# Patient Record
Sex: Male | Born: 1970 | Race: White | Hispanic: No | Marital: Married | Smoking: Never smoker
Health system: Southern US, Community
[De-identification: ages and names within clinical notes are randomized; demographics above are authoritative.]

## PROBLEM LIST (undated history)

## (undated) DIAGNOSIS — N200 Calculus of kidney: Secondary | ICD-10-CM

## (undated) DIAGNOSIS — E785 Hyperlipidemia, unspecified: Secondary | ICD-10-CM

## (undated) HISTORY — DX: Calculus of kidney: N20.0

## (undated) HISTORY — DX: Hyperlipidemia, unspecified: E78.5

---

## 2006-09-26 ENCOUNTER — Ambulatory Visit: Payer: Self-pay | Admitting: Internal Medicine

## 2006-09-26 LAB — CONVERTED CEMR LAB
ALT: 29 units/L (ref 0–40)
AST: 18 units/L (ref 0–37)
Basophils Relative: 2.3 % — ABNORMAL HIGH (ref 0.0–1.0)
Bilirubin Urine: NEGATIVE
Calcium: 9.4 mg/dL (ref 8.4–10.5)
Chloride: 105 meq/L (ref 96–112)
Cholesterol: 230 mg/dL (ref 0–200)
Eosinophil percent: 5.1 % — ABNORMAL HIGH (ref 0.0–5.0)
Glucose, Bld: 98 mg/dL (ref 70–99)
HCT: 49.8 % (ref 39.0–52.0)
HDL: 36.1 mg/dL — ABNORMAL LOW (ref >39.0)
Leukocytes, UA: NEGATIVE
Lymphocytes Relative: 27.8 % (ref 12.0–46.0)
MCV: 88.6 fL (ref 78.0–100.0)
Monocytes Absolute: 0.5 10*3/uL (ref 0.2–0.7)
Neutrophils Relative %: 56.8 % (ref 43.0–77.0)
TSH: 1.92 microintl units/mL (ref 0.35–5.50)
VLDL: 64 mg/dL — ABNORMAL HIGH (ref 0–40)
WBC: 6.5 10*3/uL (ref 4.5–10.5)
pH: 6.5 (ref 5.0–8.0)

## 2006-09-30 ENCOUNTER — Ambulatory Visit: Payer: Self-pay | Admitting: Internal Medicine

## 2007-05-06 DIAGNOSIS — E785 Hyperlipidemia, unspecified: Secondary | ICD-10-CM

## 2007-05-06 HISTORY — DX: Hyperlipidemia, unspecified: E78.5

## 2008-04-29 ENCOUNTER — Ambulatory Visit: Payer: Self-pay | Admitting: Internal Medicine

## 2008-04-29 DIAGNOSIS — F411 Generalized anxiety disorder: Secondary | ICD-10-CM | POA: Insufficient documentation

## 2008-04-29 DIAGNOSIS — R079 Chest pain, unspecified: Secondary | ICD-10-CM

## 2008-04-29 HISTORY — DX: Chest pain, unspecified: R07.9

## 2008-04-29 LAB — CONVERTED CEMR LAB: Vit D, 1,25-Dihydroxy: 26 — ABNORMAL LOW (ref 30–89)

## 2008-04-30 ENCOUNTER — Encounter: Payer: Self-pay | Admitting: Internal Medicine

## 2008-04-30 ENCOUNTER — Encounter (INDEPENDENT_AMBULATORY_CARE_PROVIDER_SITE_OTHER): Payer: Self-pay | Admitting: *Deleted

## 2008-05-14 ENCOUNTER — Ambulatory Visit: Payer: Self-pay

## 2008-05-30 ENCOUNTER — Ambulatory Visit: Payer: Self-pay | Admitting: Internal Medicine

## 2008-09-30 ENCOUNTER — Ambulatory Visit: Payer: Self-pay | Admitting: Internal Medicine

## 2008-10-02 LAB — CONVERTED CEMR LAB
Bilirubin, Direct: 0.1 mg/dL (ref 0.0–0.3)
Direct LDL: 111.7 mg/dL
HDL: 35.8 mg/dL — ABNORMAL LOW (ref >39.0)
Total Protein: 7.2 g/dL (ref 6.0–8.3)
Triglycerides: 203 mg/dL (ref 0–149)
VLDL: 41 mg/dL — ABNORMAL HIGH (ref 0–40)

## 2008-11-27 ENCOUNTER — Ambulatory Visit: Payer: Self-pay | Admitting: Internal Medicine

## 2008-11-27 DIAGNOSIS — M25559 Pain in unspecified hip: Secondary | ICD-10-CM | POA: Insufficient documentation

## 2009-05-30 ENCOUNTER — Ambulatory Visit: Payer: Self-pay | Admitting: Internal Medicine

## 2009-05-30 DIAGNOSIS — Z91018 Allergy to other foods: Secondary | ICD-10-CM | POA: Insufficient documentation

## 2009-12-16 ENCOUNTER — Ambulatory Visit: Payer: Self-pay | Admitting: Internal Medicine

## 2009-12-16 DIAGNOSIS — R209 Unspecified disturbances of skin sensation: Secondary | ICD-10-CM | POA: Insufficient documentation

## 2010-01-23 ENCOUNTER — Ambulatory Visit: Payer: Self-pay | Admitting: Internal Medicine

## 2010-01-23 DIAGNOSIS — J302 Other seasonal allergic rhinitis: Secondary | ICD-10-CM | POA: Insufficient documentation

## 2010-01-23 DIAGNOSIS — J019 Acute sinusitis, unspecified: Secondary | ICD-10-CM | POA: Insufficient documentation

## 2010-05-26 ENCOUNTER — Ambulatory Visit: Payer: Self-pay | Admitting: Internal Medicine

## 2010-05-27 LAB — CONVERTED CEMR LAB
ALT: 32 units/L (ref 0–53)
AST: 25 units/L (ref 0–37)
Alkaline Phosphatase: 57 units/L (ref 39–117)
BUN: 16 mg/dL (ref 6–23)
Basophils Relative: 0.4 % (ref 0.0–3.0)
Bilirubin, Direct: 0.1 mg/dL (ref 0.0–0.3)
Chloride: 102 meq/L (ref 96–112)
Creatinine, Ser: 1.2 mg/dL (ref 0.4–1.5)
Eosinophils Relative: 4 % (ref 0.0–5.0)
GFR calc non Af Amer: 70.08 mL/min (ref >60)
HCT: 48.5 % (ref 39.0–52.0)
Ketones, ur: NEGATIVE mg/dL
Leukocytes, UA: NEGATIVE
MCV: 91.1 fL (ref 78.0–100.0)
Monocytes Relative: 9.2 % (ref 3.0–12.0)
Neutrophils Relative %: 61.2 % (ref 43.0–77.0)
Nitrite: NEGATIVE
PSA: 0.48 ng/mL (ref 0.10–4.00)
Platelets: 228 10*3/uL (ref 150.0–400.0)
Potassium: 5.1 meq/L (ref 3.5–5.1)
RBC: 5.32 M/uL (ref 4.22–5.81)
Specific Gravity, Urine: 1.01 (ref 1.000–1.030)
Total Bilirubin: 0.8 mg/dL (ref 0.3–1.2)
Total CHOL/HDL Ratio: 6
Total Protein: 7.2 g/dL (ref 6.0–8.3)
Urobilinogen, UA: 0.2 (ref 0.0–1.0)
VLDL: 60.8 mg/dL — ABNORMAL HIGH (ref 0.0–40.0)
Vitamin B-12: 348 pg/mL (ref 211–911)
WBC: 7 10*3/uL (ref 4.5–10.5)
pH: 6.5 (ref 5.0–8.0)

## 2010-06-02 ENCOUNTER — Ambulatory Visit: Payer: Self-pay | Admitting: Internal Medicine

## 2010-10-25 LAB — CONVERTED CEMR LAB
ALT: 33 units/L (ref 0–53)
AST: 24 units/L (ref 0–37)
Albumin: 4.8 g/dL (ref 3.5–5.2)
Alkaline Phosphatase: 60 units/L (ref 39–117)
Basophils Relative: 0.7 % (ref 0.0–3.0)
Bilirubin Urine: NEGATIVE
CK-MB: 1.1 ng/mL (ref 0.3–4.0)
Calcium: 9.6 mg/dL (ref 8.4–10.5)
Cholesterol: 155 mg/dL (ref 0–200)
Direct LDL: 110.7 mg/dL
Eosinophils Relative: 1.4 % (ref 0.0–5.0)
GFR calc Af Amer: 87 mL/min
GFR calc non Af Amer: 78.61 mL/min (ref >60)
Glucose, Bld: 92 mg/dL (ref 70–99)
HCT: 46.4 % (ref 39.0–52.0)
HDL: 35.5 mg/dL — ABNORMAL LOW (ref >39.0)
Hemoglobin: 16.4 g/dL (ref 13.0–17.0)
IgE (Immunoglobulin E), Serum: 37.8 intl units/mL (ref 0.0–180.0)
Leukocytes, UA: NEGATIVE
Lymphocytes Relative: 21.9 % (ref 12.0–46.0)
Lymphs Abs: 1.4 10*3/uL (ref 0.7–4.0)
Monocytes Relative: 4.8 % (ref 3.0–12.0)
Neutro Abs: 4.4 10*3/uL (ref 1.4–7.7)
Nitrite: NEGATIVE
Potassium: 4 meq/L (ref 3.5–5.1)
RBC: 5.21 M/uL (ref 4.22–5.81)
RDW: 11.8 % (ref 11.5–14.6)
Sodium: 138 meq/L (ref 135–145)
Sodium: 141 meq/L (ref 135–145)
TSH: 1.77 microintl units/mL (ref 0.35–5.50)
Total CHOL/HDL Ratio: 6.3
Total Protein, Urine: NEGATIVE mg/dL
Total Protein: 7.7 g/dL (ref 6.0–8.3)
Triglycerides: 390 mg/dL (ref 0–149)
WBC: 6.2 10*3/uL (ref 4.5–10.5)
pH: 7.5 (ref 5.0–8.0)

## 2010-10-27 NOTE — Assessment & Plan Note (Signed)
Summary: CONGESTION/ AVP'S PT /NWS   Vital Signs:  Patient profile:   40 year old male Height:      71 inches Weight:      185 pounds BMI:     25.90 O2 Sat:      97 % on Room air Temp:     98.1 degrees F oral Pulse rate:   88 / minute Pulse rhythm:   regular Resp:     16 per minute BP sitting:   112 / 72  (left arm) Cuff size:   large  Vitals Entered By: Rock Nephew CMA (January 23, 2010 9:24 AM)  Nutrition Counseling: Patient's BMI is greater than 25 and therefore counseled on weight management options.  O2 Flow:  Room air CC: allergy congestion, green mucus x 4 days, URI symptoms Is Patient Diabetic? No Pain Assessment Patient in pain? no        Primary Care Provider:  Georgina Quint Plotnikov MD  CC:  allergy congestion, green mucus x 4 days, and URI symptoms.  History of Present Illness:  URI Symptoms      This is a 41 year old man who presents with URI symptoms.  The symptoms began 2 weeks ago.  The severity is described as moderate.  The patient reports nasal congestion, purulent nasal discharge, and sore throat, but denies dry cough, productive cough, earache, and sick contacts.  The patient denies fever, stiff neck, dyspnea, wheezing, rash, vomiting, diarrhea, use of an antipyretic, and response to antipyretic.  The patient also reports itchy throat, sneezing, seasonal symptoms, and response to antihistamine.  The patient denies headache, muscle aches, and severe fatigue.  Risk factors for Strep sinusitis include unilateral facial pain, unilateral nasal discharge, poor response to decongestant, double sickening, and absence of cough.  The patient denies the following risk factors for Strep sinusitis: Strep exposure and tender adenopathy.    Preventive Screening-Counseling & Management  Caffeine-Diet-Exercise     Does Patient Exercise: yes      Drug Use:  no.    Current Medications (verified): 1)  Adult Aspirin Low Strength 81 Mg  Tbdp (Aspirin) .... Once Daily 2)   Vitamin D3 1000 Unit  Tabs (Cholecalciferol) .Marland Kitchen.. 1 By Mouth Daily 3)  Crestor 40 Mg Tabs (Rosuvastatin Calcium) .Marland Kitchen.. 1 By Mouth Qd  Allergies (verified): No Known Drug Allergies  Past History:  Past Medical History: Reviewed history from 05/06/2007 and no changes required. Hyperlipidemia  FAMILY H/O CAD  Past Surgical History: Reviewed history from 12/16/2009 and no changes required. Denies surgical history  Family History: Reviewed history from 04/29/2008 and no changes required. Family History Hypertension F CAD 44  Social History: Reviewed history from 12/16/2009 and no changes required. 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    Physical Exam  General:  alert, well-developed, well-nourished, well-hydrated, appropriate dress, normal appearance, healthy-appearing, cooperative to examination, and good hygiene.   Head:  normocephalic, atraumatic, no abnormalities observed, and no abnormalities palpated.   Eyes:  vision grossly intact, pupils equal, pupils round, and pupils reactive to light.   Ears:  R ear normal and L ear normal.   Nose:  no airflow obstruction, no intranasal foreign body, no nasal polyps, no nasal mucosal lesions, no mucosal friability,  no active bleeding or clots, no sinus percussion tenderness, no septum abnormalities, nasal dischargemucosal pallor, mucosal erythema, and mucosal edema.   Mouth:  good dentition, pharynx pink and moist, no erythema, no exudates, no posterior lymphoid hypertrophy, no postnasal drip, no pharyngeal crowing, no lesions, no aphthous ulcers, no erosions, no tongue abnormalities, no leukoplakia, and no petechiae.   Neck:  supple,  full ROM, no masses, no thyromegaly, no JVD, normal carotid upstroke, no carotid bruits, no cervical lymphadenopathy, and no neck tenderness.   Lungs:  normal respiratory effort, no intercostal retractions, no accessory muscle use, normal breath sounds, no dullness, no fremitus, no crackles, and no wheezes.   Heart:  normal rate, regular rhythm, no murmur, no gallop, no rub, and no JVD.   Abdomen:  soft, non-tender, normal bowel sounds, no distention, no masses, no guarding, no rigidity, no rebound tenderness, no hepatomegaly, and no splenomegaly.   Msk:  normal ROM, no joint tenderness, no joint swelling, no joint warmth, no redness over joints, no joint deformities, no joint instability, and no crepitation.   Pulses:  R and L carotid,radial,femoral,dorsalis pedis and posterior tibial pulses are full and equal bilaterally Extremities:  No clubbing, cyanosis, edema, or deformity noted with normal full range of motion of all joints.   Neurologic:  No cranial nerve deficits noted. Station and gait are normal. Plantar reflexes are down-going bilaterally. DTRs are symmetrical throughout. Sensory, motor and coordinative functions appear intact. Skin:  turgor normal, color normal, no rashes, no suspicious lesions, no ecchymoses, no petechiae, no purpura, no ulcerations, and no edema.   Cervical Nodes:  no anterior cervical adenopathy and no posterior cervical adenopathy.   Axillary Nodes:  no R axillary adenopathy and no L axillary adenopathy.   Inguinal Nodes:  no R inguinal adenopathy and no L inguinal adenopathy.   Psych:  Cognition and judgment appear intact. Alert and cooperative with normal attention span and concentration. No apparent delusions, illusions, hallucinations   Impression & Recommendations:  Problem # 1:  ALLERGIC RHINITIS DUE TO OTHER ALLERGEN (ICD-477.8) Assessment New continue taking claritin-d Orders: Admin of Therapeutic Inj  intramuscular or subcutaneous (96045) Depo- Medrol  40mg  (J1030) Depo- Medrol 80mg  (J1040)  Problem # 2:  SINUSITIS- ACUTE-NOS (ICD-461.9) Assessment: New  His updated medication list for this problem includes:    Cefuroxime Axetil 500 Mg Tabs (Cefuroxime axetil) ..... One by mouth two times a day  Complete Medication List: 1)  Adult Aspirin Low Strength 81 Mg Tbdp (Aspirin) .... Once daily 2)  Vitamin D3 1000 Unit Tabs (Cholecalciferol) .Marland Kitchen.. 1 by mouth daily 3)  Crestor 40 Mg Tabs (Rosuvastatin calcium) .Marland Kitchen.. 1 by mouth qd 4)  Cefuroxime Axetil 500 Mg Tabs (Cefuroxime axetil) .... One by mouth two times a day  Patient Instructions: 1)  Please schedule a follow-up appointment in 2 weeks. 2)  Take over the counter cough and cold medications only as directed on the package insert. DO NOT take more than recommended . 3)  Acute sinusitis symptoms for less than 10 days are not helped by antibiotics.Use warm moist compresses, and over the counter decongestants ( only as directed). Call if no improvement in 5-7 days, sooner if increasing pain, fever, or new symptoms. Prescriptions: CEFUROXIME AXETIL 500 MG TABS (CEFUROXIME AXETIL) One by mouth two times a day  #20 x 1   Entered and Authorized by:   Etta Grandchild MD   Signed by:   Etta Grandchild MD on 01/23/2010   Method used:  Electronically to        [facility]  [address] [phone]* (retail)       [address]       [place], [place]  [address]       Ph: [phone] or [phone]       Fax: [phone]   RxID:   [id number]    Medication Administration  Injection # 1:    Medication: Depo- Medrol 80mg     Diagnosis: ALLERGIC RHINITIS DUE TO OTHER ALLERGEN (ICD-477.8)    Route: IM    Site: R deltoid    Exp Date: 07/2012    Lot #: [id number]    Mfr: pfizer    Patient tolerated injection without complications    Given by: Rock Nephew CMA (January 23, 2010 9:42 AM)  Injection # 2:    Medication: Depo- Medrol 40mg     Diagnosis: ALLERGIC RHINITIS DUE TO OTHER ALLERGEN  (ICD-477.8)    Route: IM    Site: R deltoid    Exp Date: 07/2012    Lot #: [id number]    Mfr: pfizer    Patient tolerated injection without complications    Given by: Rock Nephew CMA (January 23, 2010 9:42 AM)  Orders Added: 1)  Admin of Therapeutic Inj  intramuscular or subcutaneous [96372] 2)  Depo- Medrol 40mg  [J1030] 3)  Depo- Medrol 80mg  [J1040] 4)  Est. Patient Level IV [37106]

## 2010-10-27 NOTE — Assessment & Plan Note (Signed)
Summary: PER PT 6 MTH FU--$50--STC  RS'D PER PT/NWS   Vital Signs:  Patient profile:   40 year old male Weight:      186 pounds Temp:     98.2 degrees F oral Pulse rate:   98 / minute BP sitting:   126 / 80  (left arm)  Vitals Entered By: Tora Perches (December 16, 2009 2:37 PM) CC: f/u Is Patient Diabetic? No   CC:  f/u.  History of Present Illness: F/u hyperlipidemia, stress, CP. C/o occasional L hand falling asleeep at night x 2-4 wks  Preventive Screening-Counseling & Management  Alcohol-Tobacco     Smoking Status: never  Current Medications (verified): 1)  Adult Aspirin Low Strength 81 Mg  Tbdp (Aspirin) .... Once Daily 2)  Vitamin D3 1000 Unit  Tabs (Cholecalciferol) .Marland Kitchen.. 1 By Mouth Daily 3)  Crestor 40 Mg Tabs (Rosuvastatin Calcium) .Marland Kitchen.. 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    Physical Exam  General:  Well-developed,well-nourished,in no acute distress; alert,appropriate and cooperative throughout examination Nose:  External nasal examination shows no deformity or inflammation. Nasal mucosa are pink and moist without lesions or exudates. Mouth:  Oral mucosa and oropharynx without lesions or exudates.  Teeth in good repair. Lungs:  Normal respiratory effort, chest expands symmetrically. Lungs are clear to auscultation, no crackles or wheezes. Heart:  Normal rate and regular rhythm. S1 and S2 normal without gallop, murmur, click, rub or other extra sounds. Abdomen:  Bowel sounds positive,abdomen soft and non-tender without masses, organomegaly or hernias noted. Msk:  No deformity or scoliosis noted of thoracic  or lumbar spine.   Neurologic:  No cranial nerve deficits noted. Station and gait are normal. Plantar reflexes are down-going bilaterally. DTRs are symmetrical throughout. Sensory, motor and coordinative functions appear intact. Skin:  Intact without suspicious lesions or rashes Psych:  Cognition and judgment appear intact. Alert and cooperative with normal attention span and concentration. No apparent delusions, illusions, hallucinations   Impression & Recommendations:  Problem # 1:  HYPERLIPIDEMIA (ICD-272.4) Assessment Improved  His updated medication list for this problem includes:    Crestor 40 Mg Tabs (Rosuvastatin calcium) .Marland Kitchen... 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 Once daily 2)  Vitamin D3 1000 Unit Tabs (Cholecalciferol) .Marland Kitchen.. 1 by mouth daily 3)  Crestor 40 Mg Tabs (Rosuvastatin calcium) .Marland Kitchen.. 1 by mouth qd  Patient Instructions: 1)  Please schedule a follow-up appointment in 6 months well w/labs and B12 v70.0 782.0. Prescriptions: CRESTOR 40 MG TABS (ROSUVASTATIN CALCIUM) 1 by mouth qd  #90 x 3   Entered and Authorized by:   Tresa Garter MD   Signed by:   Tresa Garter MD on 12/16/2009   Method used:   Print then Give to Patient   RxID:   [id number]

## 2011-02-12 NOTE — Assessment & Plan Note (Signed)
[facility]                           PRIMARY CARE OFFICE NOTE   Brandon Atkinson, Brandon Atkinson                  MRN:          [id number]  DATE:09/30/2006                            DOB:          September 23, 1971    The patient is a 40 year old male who presents for a wellness  examination.   Past medical history, family history and social history as per October 05, 2002. Planning to go to [place] to ski.   ALLERGIES:  None.   CURRENT MEDICATIONS:  Aspirin daily.   REVIEW OF SYSTEMS:  No chest pain or shortness of breath, very active  physically, no syncope. The rest is negative of the 18-point system  review is negative.   PHYSICAL EXAMINATION:  VITAL SIGNS:  Blood pressure 114/76, pulse 96,  temperature 98.2, weight 184 pounds (was 171).  GENERAL:  Looks well.  HEENT:  Moist mucosa.  NECK:  Supple, no thyromegaly or bruits.  LUNGS:  Clear, no wheezes or rales.  HEART:  S1, S2, no murmur, no gallop.  ABDOMEN:  Soft, nontender, no organomegaly or mass felt.  EXTREMITIES:  Lower extremity is without edema.  NEUROLOGIC:  He is alert, oriented and cooperative. He denies being  depressed.  SKIN:  Clear.   LABORATORY DATA:  In 2004, calcium scoring was negative. On October 27, 2005, CBC normal, CMET normal, cholesterol 238, triglycerides 320, TSH  normal, urinalysis normal.   ASSESSMENT/PLAN:  1. Normal wellness examination. Age/health-related issues discussed.      Healthy lifestyle discussed. We discussed his father's history of      early coronary disease. He will continue with aspirin. Will get      some kind of a stress test when he is about 40 years old. He will      let me know if there are any problems. He will continue to      exercise.  2. Dyslipidemia. Low fat diet, lose weight, continue to exercise. He      can use fish oil 1-2 daily. The option of taking a prescription      statin discussed again. He is not interested. Will recheck  labs in      6 months.  3. Travel. Ambien 10 mg 1/2-1 p.r.n.    Georgina Quint. Plotnikov, MD  Electronically Signed   AVP/MedQ  DD: 10/03/2006  DT: 10/03/2006  Job #: [id number]

## 2012-08-21 ENCOUNTER — Ambulatory Visit (INDEPENDENT_AMBULATORY_CARE_PROVIDER_SITE_OTHER): Payer: BC Managed Care – PPO | Admitting: Internal Medicine

## 2012-08-21 ENCOUNTER — Encounter: Payer: Self-pay | Admitting: Internal Medicine

## 2012-08-21 ENCOUNTER — Ambulatory Visit (INDEPENDENT_AMBULATORY_CARE_PROVIDER_SITE_OTHER)
Admission: RE | Admit: 2012-08-21 | Discharge: 2012-08-21 | Disposition: A | Discharge status: Home / Self Care | Payer: BC Managed Care – PPO | Source: Ambulatory Visit | Attending: Internal Medicine | Admitting: Internal Medicine

## 2012-08-21 VITALS — BP 138/80 | HR 102 | Temp 97.2°F | Ht 69.0 in | Wt 183.5 lb

## 2012-08-21 DIAGNOSIS — R079 Chest pain, unspecified: Secondary | ICD-10-CM

## 2012-08-21 DIAGNOSIS — R030 Elevated blood-pressure reading, without diagnosis of hypertension: Secondary | ICD-10-CM

## 2012-08-21 DIAGNOSIS — F411 Generalized anxiety disorder: Secondary | ICD-10-CM

## 2012-08-21 MED ORDER — OMEPRAZOLE 20 MG PO CPDR: 20.0000 mg | DELAYED_RELEASE_CAPSULE | Freq: Every day | ORAL | Status: DC

## 2012-08-21 NOTE — Assessment & Plan Note (Signed)
stable overall by hx and exam, most recent data reviewed with pt, and pt to continue medical treatment as before BP Readings from Last 3 Encounters:  08/21/12 138/80  01/23/10 112/72  12/16/09 126/80

## 2012-08-21 NOTE — Assessment & Plan Note (Addendum)
ECG reviewed as per emr, etiology unclear but fleeting, atypical, doubt CV related, for [facility] trial PPI, and  to f/u any worsening symptoms or concerns with PCP or sooner if needed

## 2012-08-21 NOTE — Progress Notes (Signed)
  Subjective:    Patient ID: Brandon Atkinson, male    DOB: 1971/03/24, 41 y.o.   MRN: [id number]  HPI  Here with c/o anterior CP, sharp, fleeting 1-2 seconds with tingling to right arm, without assoc symtpoms or n/v, diaphoresis, palps or syncope -  3 times per hr yesterday, none today, similar discomfort as before for several years but more freq yesterday, nonpleuritic, nonexertional , occurs at rest, thought to be ? Related to stress but less stress now that work has calmed down recently, though father died 2 yrs ago and has been thinking about that recently as well;  Ongoing Since his late 20's, last stress test 2009 neg for ischemia per pt.  Was prescirbed zantac 2009 but cannot recall if helped.  Denies worsening reflux, dysphagia, abd pain, n/v, bowel change or blood.   Pt denies fever, wt loss, night sweats, loss of appetite, or other constitutional symptoms  Worried mostly b/c of father hx of heart disease.  Did have recent elev BP as well but none recent. Denies worsening depressive symptoms, suicidal ideation, or panic Past Medical History  Diagnosis Date  . Hyperlipidemia    History reviewed. No pertinent past surgical history.  reports that he has never smoked. He has never used smokeless tobacco. He reports that he does not drink alcohol or use illicit drugs. family history includes Heart disease in his father and Hypertension in his father. No Known Allergies Current Outpatient Prescriptions on File Prior to Visit  Medication Sig Dispense Refill  . omeprazole (PRILOSEC) 20 MG capsule Take 1 capsule (20 mg total) by mouth daily.  90 capsule  3    aReview of Systems  Constitutional: Negative for diaphoresis and unexpected weight change.  HENT: Negative for tinnitus.   Eyes: Negative for photophobia and visual disturbance.  Respiratory: Negative for choking and stridor.   Gastrointestinal: Negative for vomiting and blood in stool.  Genitourinary: Negative for hematuria and  decreased urine volume.  Musculoskeletal: Negative for gait problem.  Skin: Negative for color change and wound.  Neurological: Negative for tremors and numbness.  Psychiatric/Behavioral: Negative for decreased concentration. The patient is not hyperactive.       Objective:   Physical Exam BP 138/80  Pulse 102  Temp 97.2 F (36.2 C) (Oral)  Ht 5\' 9"  (1.753 m)  Wt 183 lb 8 oz (83.235 kg)  BMI 27.10 kg/m2  SpO2 96% Physical Exam  VS noted, not ill appearing Constitutional: Pt appears well-developed and well-nourished.  HENT: Head: Normocephalic.  Right Ear: External ear normal.  Left Ear: External ear normal.  Eyes: Conjunctivae and EOM are normal. Pupils are equal, round, and reactive to light.  Neck: Normal range of motion. Neck supple.  Cardiovascular: Normal rate and regular rhythm.   Pulmonary/Chest: Effort normal and breath sounds normal.  Abd:  Soft, NT, non-distended, + BS Neurological: Pt is alert. Not confused  Skin: Skin is warm. No erythema.  Psychiatric: Pt behavior is normal. Thought content normal. 1+ normal    Assessment & Plan:

## 2012-08-21 NOTE — Assessment & Plan Note (Signed)
stable overall by hx and exam, most recent data reviewed with pt, and pt to continue medical treatment as before Lab Results  Component Value Date   LDLCALC 91 05/30/2009

## 2012-08-21 NOTE — Patient Instructions (Addendum)
Your EKG was ok today Take all new medications as prescribed - the prilosec 20 mg per day Please go to XRAY in the Basement for the x-ray test You will be contacted by phone if any changes need to be made immediately.  Otherwise, you will receive a letter about your results with an explanation, but please check with MyChart first. Thank you for enrolling in MyChart. Please follow the instructions below to securely access your online medical record. MyChart allows you to send messages to your doctor, view your test results, renew your prescriptions, schedule appointments, and more. To Log into MyChart, please go to [email or web address], and your Username is:  [email or web address]

## 2012-10-11 ENCOUNTER — Other Ambulatory Visit: Payer: Self-pay | Admitting: Internal Medicine

## 2012-10-24 ENCOUNTER — Other Ambulatory Visit (INDEPENDENT_AMBULATORY_CARE_PROVIDER_SITE_OTHER): Payer: BC Managed Care – PPO

## 2012-10-24 ENCOUNTER — Ambulatory Visit (INDEPENDENT_AMBULATORY_CARE_PROVIDER_SITE_OTHER): Payer: BC Managed Care – PPO | Admitting: Internal Medicine

## 2012-10-24 ENCOUNTER — Encounter: Payer: Self-pay | Admitting: Internal Medicine

## 2012-10-24 VITALS — BP 130/90 | HR 80 | Temp 97.0°F | Resp 16 | Wt 184.0 lb

## 2012-10-24 DIAGNOSIS — Z0000 Encounter for general adult medical examination without abnormal findings: Secondary | ICD-10-CM

## 2012-10-24 DIAGNOSIS — Z23 Encounter for immunization: Secondary | ICD-10-CM

## 2012-10-24 DIAGNOSIS — L219 Seborrheic dermatitis, unspecified: Secondary | ICD-10-CM | POA: Insufficient documentation

## 2012-10-24 DIAGNOSIS — E785 Hyperlipidemia, unspecified: Secondary | ICD-10-CM

## 2012-10-24 LAB — BASIC METABOLIC PANEL
BUN: 19 mg/dL (ref 6–23)
CO2: 29 mEq/L (ref 19–32)
Chloride: 102 mEq/L (ref 96–112)
Creatinine, Ser: 1.2 mg/dL (ref 0.4–1.5)

## 2012-10-24 LAB — CBC WITH DIFFERENTIAL/PLATELET
Basophils Relative: 0.5 % (ref 0.0–3.0)
Eosinophils Absolute: 0.2 10*3/uL (ref 0.0–0.7)
HCT: 49.2 % (ref 39.0–52.0)
Lymphs Abs: 1.6 10*3/uL (ref 0.7–4.0)
MCHC: 34 g/dL (ref 30.0–36.0)
MCV: 88.9 fl (ref 78.0–100.0)
Monocytes Absolute: 0.5 10*3/uL (ref 0.1–1.0)
Neutrophils Relative %: 64.5 % (ref 43.0–77.0)
RBC: 5.53 Mil/uL (ref 4.22–5.81)

## 2012-10-24 LAB — HEPATIC FUNCTION PANEL
ALT: 48 U/L (ref 0–53)
AST: 27 U/L (ref 0–37)
Alkaline Phosphatase: 54 U/L (ref 39–117)
Bilirubin, Direct: 0.1 mg/dL (ref 0.0–0.3)
Total Bilirubin: 0.7 mg/dL (ref 0.3–1.2)

## 2012-10-24 LAB — URINALYSIS
Ketones, ur: NEGATIVE
Specific Gravity, Urine: 1.015 (ref 1.000–1.030)
Total Protein, Urine: NEGATIVE
Urine Glucose: NEGATIVE
pH: 7 (ref 5.0–8.0)

## 2012-10-24 LAB — LIPID PANEL: Triglycerides: 213 mg/dL — ABNORMAL HIGH (ref 0.0–149.0)

## 2012-10-24 LAB — LDL CHOLESTEROL, DIRECT: Direct LDL: 105.5 mg/dL

## 2012-10-24 MED ORDER — CLOTRIMAZOLE-BETAMETHASONE 1-0.05 % EX CREA: TOPICAL_CREAM | Freq: Two times a day (BID) | CUTANEOUS | Status: DC

## 2012-10-24 MED ORDER — ROSUVASTATIN CALCIUM 40 MG PO TABS: 40.0000 mg | ORAL_TABLET | Freq: Every day | ORAL | Status: DC

## 2012-10-24 NOTE — Progress Notes (Signed)
  Subjective:     HPI  The patient is here for a wellness exam. The patient has been doing well overall without major physical or psychological issues going on lately. F/u dyslipidemia, chest pains, GERD  BP Readings from Last 3 Encounters:  10/24/12 130/90  08/21/12 138/80  01/23/10 112/72   Wt Readings from Last 3 Encounters:  10/24/12 184 lb (83.462 kg)  08/21/12 183 lb 8 oz (83.235 kg)  01/23/10 185 lb (83.915 kg)     Review of Systems  Constitutional: Negative for appetite change, fatigue and unexpected weight change.  HENT: Negative for hearing loss, nosebleeds, congestion, sore throat, sneezing, trouble swallowing and neck pain.   Eyes: Negative for itching and visual disturbance.  Respiratory: Negative for cough, chest tightness, wheezing and stridor.   Cardiovascular: Negative for chest pain, palpitations and leg swelling.  Gastrointestinal: Negative for nausea, vomiting, diarrhea, blood in stool and abdominal distention.  Genitourinary: Negative for frequency and hematuria.  Musculoskeletal: Negative for back pain, joint swelling and gait problem.  Skin: Negative for rash and wound.  Neurological: Negative for dizziness, tremors, speech difficulty and weakness.  Psychiatric/Behavioral: Negative for suicidal ideas, behavioral problems, confusion, sleep disturbance, dysphoric mood and agitation. The patient is not nervous/anxious.        Objective:   Physical Exam  Constitutional: He is oriented to person, place, and time. He appears well-developed.  HENT:  Mouth/Throat: Oropharynx is clear and moist.  Eyes: Conjunctivae normal are normal. Pupils are equal, round, and reactive to light.  Neck: Normal range of motion. No JVD present. No thyromegaly present.  Cardiovascular: Normal rate, regular rhythm, normal heart sounds and intact distal pulses.  Exam reveals no gallop and no friction rub.   No murmur heard. Pulmonary/Chest: Effort normal and breath sounds normal.  No respiratory distress. He has no wheezes. He has no rales. He exhibits no tenderness.  Abdominal: Soft. Bowel sounds are normal. He exhibits no distension and no mass. There is no tenderness. There is no rebound and no guarding.  Genitourinary:       Self exam nl  Musculoskeletal: Normal range of motion. He exhibits no edema and no tenderness.  Lymphadenopathy:    He has no cervical adenopathy.  Neurological: He is alert and oriented to person, place, and time. He has normal reflexes. No cranial nerve deficit. He exhibits normal muscle tone. Coordination normal.  Skin: Skin is warm and dry. No rash noted.  Psychiatric: He has a normal mood and affect. His behavior is normal. Judgment and thought content normal.   Lab Results  Component Value Date   WBC 7.0 05/26/2010   HGB 16.8 05/26/2010   HCT 48.5 05/26/2010   PLT 228.0 05/26/2010   GLUCOSE 83 05/26/2010   CHOL 214* 05/26/2010   TRIG 304.0* 05/26/2010   HDL 38.70* 05/26/2010   LDLDIRECT 126.2 05/26/2010   LDLCALC 91 05/30/2009   ALT 32 05/26/2010   AST 25 05/26/2010   NA 140 05/26/2010   K 5.1 05/26/2010   CL 102 05/26/2010   CREATININE 1.2 05/26/2010   BUN 16 05/26/2010   CO2 30 05/26/2010   TSH 1.23 05/26/2010   PSA 0.48 05/26/2010        Assessment & Plan:

## 2012-10-24 NOTE — Assessment & Plan Note (Signed)
Lotrisone prn 

## 2012-10-25 ENCOUNTER — Encounter: Payer: Self-pay | Admitting: Internal Medicine

## 2012-10-25 DIAGNOSIS — Z0000 Encounter for general adult medical examination without abnormal findings: Secondary | ICD-10-CM | POA: Insufficient documentation

## 2012-10-25 NOTE — Assessment & Plan Note (Signed)
We discussed age appropriate health related issues, including available/recomended screening tests and vaccinations. We discussed a need for adhering to healthy diet and exercise. Labs/EKG were reviewed/ordered. All questions were answered.   

## 2012-10-25 NOTE — Assessment & Plan Note (Signed)
Continue with current prescription therapy as reflected on the Med list.  

## 2012-11-11 ENCOUNTER — Other Ambulatory Visit: Payer: Self-pay

## 2013-08-02 ENCOUNTER — Other Ambulatory Visit: Payer: Self-pay

## 2013-12-10 ENCOUNTER — Ambulatory Visit (INDEPENDENT_AMBULATORY_CARE_PROVIDER_SITE_OTHER): Payer: BC Managed Care – PPO | Admitting: Internal Medicine

## 2013-12-10 ENCOUNTER — Encounter: Payer: Self-pay | Admitting: Internal Medicine

## 2013-12-10 ENCOUNTER — Other Ambulatory Visit (INDEPENDENT_AMBULATORY_CARE_PROVIDER_SITE_OTHER): Payer: BC Managed Care – PPO

## 2013-12-10 VITALS — BP 140/82 | HR 72 | Temp 97.9°F | Resp 16 | Ht 71.0 in | Wt 189.0 lb

## 2013-12-10 DIAGNOSIS — E785 Hyperlipidemia, unspecified: Secondary | ICD-10-CM

## 2013-12-10 DIAGNOSIS — Z0000 Encounter for general adult medical examination without abnormal findings: Secondary | ICD-10-CM

## 2013-12-10 DIAGNOSIS — R011 Cardiac murmur, unspecified: Secondary | ICD-10-CM

## 2013-12-10 DIAGNOSIS — R010 Benign and innocent cardiac murmurs: Secondary | ICD-10-CM | POA: Insufficient documentation

## 2013-12-10 LAB — CBC WITH DIFFERENTIAL/PLATELET
Basophils Absolute: 0 10*3/uL (ref 0.0–0.1)
Basophils Relative: 0.2 % (ref 0.0–3.0)
EOS PCT: 3.3 % (ref 0.0–5.0)
Eosinophils Absolute: 0.2 10*3/uL (ref 0.0–0.7)
HCT: 49.1 % (ref 39.0–52.0)
Hemoglobin: 16.9 g/dL (ref 13.0–17.0)
Lymphocytes Relative: 22.2 % (ref 12.0–46.0)
Lymphs Abs: 1.6 10*3/uL (ref 0.7–4.0)
MCHC: 34.5 g/dL (ref 30.0–36.0)
MCV: 89.1 fl (ref 78.0–100.0)
MONO ABS: 0.6 10*3/uL (ref 0.1–1.0)
Monocytes Relative: 9 % (ref 3.0–12.0)
Neutro Abs: 4.6 10*3/uL (ref 1.4–7.7)
Neutrophils Relative %: 65.3 % (ref 43.0–77.0)
PLATELETS: 243 10*3/uL (ref 150.0–400.0)
RBC: 5.51 Mil/uL (ref 4.22–5.81)
RDW: 12.4 % (ref 11.5–14.6)
WBC: 7 10*3/uL (ref 4.5–10.5)

## 2013-12-10 LAB — BASIC METABOLIC PANEL
BUN: 18 mg/dL (ref 6–23)
CALCIUM: 9.7 mg/dL (ref 8.4–10.5)
CO2: 32 mEq/L (ref 19–32)
Chloride: 101 mEq/L (ref 96–112)
Creatinine, Ser: 1.2 mg/dL (ref 0.4–1.5)
GFR: 70.95 mL/min (ref >60.00)
Glucose, Bld: 96 mg/dL (ref 70–99)
Potassium: 4.4 mEq/L (ref 3.5–5.1)
SODIUM: 139 meq/L (ref 135–145)

## 2013-12-10 LAB — URINALYSIS
Bilirubin Urine: NEGATIVE
HGB URINE DIPSTICK: NEGATIVE
KETONES UR: NEGATIVE
Leukocytes, UA: NEGATIVE
Nitrite: NEGATIVE
Specific Gravity, Urine: 1.02 (ref 1.000–1.030)
Total Protein, Urine: NEGATIVE
URINE GLUCOSE: NEGATIVE
UROBILINOGEN UA: 0.2 (ref 0.0–1.0)
pH: 6.5 (ref 5.0–8.0)

## 2013-12-10 LAB — LIPID PANEL
CHOL/HDL RATIO: 5
Cholesterol: 174 mg/dL (ref 0–200)
HDL: 38.4 mg/dL — AB (ref >39.00)
LDL Cholesterol: 94 mg/dL (ref 0–99)
Triglycerides: 209 mg/dL — ABNORMAL HIGH (ref 0.0–149.0)
VLDL: 41.8 mg/dL — ABNORMAL HIGH (ref 0.0–40.0)

## 2013-12-10 LAB — HEPATIC FUNCTION PANEL
ALK PHOS: 64 U/L (ref 39–117)
ALT: 33 U/L (ref 0–53)
AST: 24 U/L (ref 0–37)
Albumin: 5 g/dL (ref 3.5–5.2)
Bilirubin, Direct: 0.1 mg/dL (ref 0.0–0.3)
Total Bilirubin: 0.8 mg/dL (ref 0.3–1.2)
Total Protein: 7.9 g/dL (ref 6.0–8.3)

## 2013-12-10 LAB — TSH: TSH: 1.49 u[IU]/mL (ref 0.35–5.50)

## 2013-12-10 MED ORDER — ROSUVASTATIN CALCIUM 40 MG PO TABS: 40.0000 mg | ORAL_TABLET | Freq: Every day | ORAL | Status: DC

## 2013-12-10 NOTE — Progress Notes (Signed)
Pre visit review using our clinic review tool, if applicable. No additional management support is needed unless otherwise documented below in the visit note. 

## 2013-12-10 NOTE — Assessment & Plan Note (Signed)
We discussed age appropriate health related issues, including available/recomended screening tests and vaccinations. We discussed a need for adhering to healthy diet and exercise. Labs/EKG were reviewed/ordered. All questions were answered.   

## 2013-12-10 NOTE — Progress Notes (Signed)
   Subjective:     HPI  The patient is here for a wellness exam. The patient has been doing well overall without major physical or psychological issues going on lately.  F/u dyslipidemia, GERD  BP Readings from Last 3 Encounters:  12/10/13 140/82  10/24/12 130/90  08/21/12 138/80   Wt Readings from Last 3 Encounters:  12/10/13 189 lb (85.73 kg)  10/24/12 184 lb (83.462 kg)  08/21/12 183 lb 8 oz (83.235 kg)     Review of Systems  Constitutional: Negative for appetite change, fatigue and unexpected weight change.  HENT: Negative for congestion, hearing loss, nosebleeds, sneezing, sore throat and trouble swallowing.   Eyes: Negative for itching and visual disturbance.  Respiratory: Negative for cough, chest tightness, wheezing and stridor.   Cardiovascular: Negative for chest pain, palpitations and leg swelling.  Gastrointestinal: Negative for nausea, vomiting, diarrhea, blood in stool and abdominal distention.  Genitourinary: Negative for frequency and hematuria.  Musculoskeletal: Negative for back pain, gait problem, joint swelling and neck pain.  Skin: Negative for rash and wound.  Neurological: Negative for dizziness, tremors, speech difficulty and weakness.  Psychiatric/Behavioral: Negative for suicidal ideas, behavioral problems, confusion, sleep disturbance, dysphoric mood and agitation. The patient is not nervous/anxious.        Objective:   Physical Exam  Constitutional: He is oriented to person, place, and time. He appears well-developed.  HENT:  Mouth/Throat: Oropharynx is clear and moist.  Eyes: Conjunctivae are normal. Pupils are equal, round, and reactive to light.  Neck: Normal range of motion. No JVD present. No thyromegaly present.  Cardiovascular: Normal rate, regular rhythm, normal heart sounds and intact distal pulses.  Exam reveals no gallop and no friction rub.   No murmur heard. Pulmonary/Chest: Effort normal and breath sounds normal. No respiratory  distress. He has no wheezes. He has no rales. He exhibits no tenderness.  Abdominal: Soft. Bowel sounds are normal. He exhibits no distension and no mass. There is no tenderness. There is no rebound and no guarding.  Genitourinary:  Self exam nl  Musculoskeletal: Normal range of motion. He exhibits no edema and no tenderness.  Lymphadenopathy:    He has no cervical adenopathy.  Neurological: He is alert and oriented to person, place, and time. He has normal reflexes. No cranial nerve deficit. He exhibits normal muscle tone. Coordination normal.  Skin: Skin is warm and dry. No rash noted.  Psychiatric: He has a normal mood and affect. His behavior is normal. Judgment and thought content normal.  1-2 heart murmur Lab Results  Component Value Date   WBC 6.7 10/24/2012   HGB 16.7 10/24/2012   HCT 49.2 10/24/2012   PLT 234.0 10/24/2012   GLUCOSE 97 10/24/2012   CHOL 184 10/24/2012   TRIG 213.0* 10/24/2012   HDL 36.90* 10/24/2012   LDLDIRECT 105.5 10/24/2012   LDLCALC 91 05/30/2009   ALT 48 10/24/2012   AST 27 10/24/2012   NA 137 10/24/2012   K 4.2 10/24/2012   CL 102 10/24/2012   CREATININE 1.2 10/24/2012   BUN 19 10/24/2012   CO2 29 10/24/2012   TSH 1.64 10/24/2012   PSA 0.48 05/26/2010   EKG     Assessment & Plan:

## 2013-12-10 NOTE — Assessment & Plan Note (Signed)
2D ECHO 

## 2013-12-10 NOTE — Assessment & Plan Note (Signed)
Continue with current prescription therapy as reflected on the Med list.  

## 2015-12-22 ENCOUNTER — Encounter: Payer: Self-pay | Admitting: Internal Medicine

## 2015-12-22 ENCOUNTER — Ambulatory Visit (INDEPENDENT_AMBULATORY_CARE_PROVIDER_SITE_OTHER): Payer: BLUE CROSS/BLUE SHIELD | Admitting: Internal Medicine

## 2015-12-22 VITALS — BP 124/80 | HR 88 | Ht 71.0 in | Wt 189.0 lb

## 2015-12-22 DIAGNOSIS — Z0000 Encounter for general adult medical examination without abnormal findings: Secondary | ICD-10-CM

## 2015-12-22 NOTE — Assessment & Plan Note (Signed)
We discussed age appropriate health related issues, including available/recomended screening tests and vaccinations. We discussed a need for adhering to healthy diet and exercise. Labs/EKG were reviewed/ordered. All questions were answered.   

## 2015-12-22 NOTE — Assessment & Plan Note (Signed)
-   Crestor 

## 2015-12-22 NOTE — Progress Notes (Signed)
Pre visit review using our clinic review tool, if applicable. No additional management support is needed unless otherwise documented below in the visit note. 

## 2015-12-22 NOTE — Progress Notes (Signed)
Subjective:  Patient ID: Brandon Atkinson, male    DOB: Oct 18, 1970  Age: 45 y.o. MRN: [id number]  CC: Annual Exam   HPI Brandon NeedsRobert A Borenstein presents for a well exam  Outpatient Prescriptions Prior to Visit  Medication Sig Dispense Refill  . aspirin 81 MG tablet Take 81 mg by mouth daily.    . rosuvastatin (CRESTOR) 40 MG tablet Take 1 tablet (40 mg total) by mouth daily. 90 tablet 3  . VITAMIN D, CHOLECALCIFEROL, PO Take by mouth daily.    Marland Kitchen. omeprazole (PRILOSEC) 20 MG capsule Take 1 capsule (20 mg total) by mouth daily. (Patient not taking: Reported on 12/22/2015) 90 capsule 3   No facility-administered medications prior to visit.    ROS Review of Systems  Constitutional: Negative for appetite change, fatigue and unexpected weight change.  HENT: Negative for congestion, nosebleeds, sneezing, sore throat and trouble swallowing.   Eyes: Negative for itching and visual disturbance.  Respiratory: Negative for cough.   Cardiovascular: Negative for chest pain, palpitations and leg swelling.  Gastrointestinal: Negative for nausea, diarrhea, blood in stool and abdominal distention.  Genitourinary: Negative for frequency and hematuria.  Musculoskeletal: Negative for back pain, joint swelling, gait problem and neck pain.  Skin: Negative for rash.  Neurological: Negative for dizziness, tremors, speech difficulty and weakness.  Psychiatric/Behavioral: Negative for suicidal ideas, sleep disturbance, dysphoric mood and agitation. The patient is not nervous/anxious.     Objective:  BP 124/80 mmHg  Pulse 88  Ht 5\' 11"  (1.803 m)  Wt 189 lb (85.73 kg)  BMI 26.37 kg/m2  SpO2 98%  BP Readings from Last 3 Encounters:  12/22/15 124/80  12/10/13 140/82  10/24/12 130/90    Wt Readings from Last 3 Encounters:  12/22/15 189 lb (85.73 kg)  12/10/13 189 lb (85.73 kg)  10/24/12 184 lb (83.462 kg)    Physical Exam  Constitutional: He is oriented to person, place, and time. He appears  well-developed. No distress.  NAD  HENT:  Mouth/Throat: Oropharynx is clear and moist.  Eyes: Conjunctivae are normal. Pupils are equal, round, and reactive to light.  Neck: Normal range of motion. No JVD present. No thyromegaly present.  Cardiovascular: Normal rate, regular rhythm, normal heart sounds and intact distal pulses.  Exam reveals no gallop and no friction rub.   No murmur heard. Pulmonary/Chest: Effort normal and breath sounds normal. No respiratory distress. He has no wheezes. He has no rales. He exhibits no tenderness.  Abdominal: Soft. Bowel sounds are normal. He exhibits no distension and no mass. There is no tenderness. There is no rebound and no guarding.  Genitourinary: Penis normal. No penile tenderness.  Musculoskeletal: Normal range of motion. He exhibits no edema or tenderness.  Lymphadenopathy:    He has no cervical adenopathy.  Neurological: He is alert and oriented to person, place, and time. He has normal reflexes. No cranial nerve deficit. He exhibits normal muscle tone. He displays a negative Romberg sign. Coordination and gait normal.  Skin: Skin is warm and dry. No rash noted.  Psychiatric: He has a normal mood and affect. His behavior is normal. Judgment and thought content normal.  testes nl B  Lab Results  Component Value Date   WBC 7.0 12/10/2013   HGB 16.9 12/10/2013   HCT 49.1 12/10/2013   PLT 243.0 12/10/2013   GLUCOSE 96 12/10/2013   CHOL 174 12/10/2013   TRIG 209.0* 12/10/2013   HDL 38.40* 12/10/2013   LDLDIRECT 105.5 10/24/2012   LDLCALC 94  12/10/2013   ALT 33 12/10/2013   AST 24 12/10/2013   NA 139 12/10/2013   K 4.4 12/10/2013   CL 101 12/10/2013   CREATININE 1.2 12/10/2013   BUN 18 12/10/2013   CO2 32 12/10/2013   TSH 1.49 12/10/2013   PSA 0.48 05/26/2010    Dg Chest 2 View  08/21/2012  *RADIOLOGY REPORT* Clinical Data: Recurrent anterior chest pain. CHEST - 2 VIEW Comparison: 04/29/2008 Findings: Lungs are clear.   Cardiomediastinal silhouette and remainder of the exam is unchanged. IMPRESSION: No acute cardiopulmonary disease. Original Report Authenticated By: Elberta Fortis, M.D.    Assessment & Plan:   Renard was seen today for annual exam.  Diagnoses and all orders for this visit:  Well adult exam  I am having Mr. Sauls maintain his aspirin, (VITAMIN D, CHOLECALCIFEROL, PO), omeprazole, and rosuvastatin.  No orders of the defined types were placed in this encounter.     Follow-up: Return in about 1 year (around 12/21/2016) for Wellness Exam.  Sonda Primes, MD

## 2015-12-23 ENCOUNTER — Other Ambulatory Visit (INDEPENDENT_AMBULATORY_CARE_PROVIDER_SITE_OTHER): Payer: BLUE CROSS/BLUE SHIELD

## 2015-12-23 DIAGNOSIS — Z0000 Encounter for general adult medical examination without abnormal findings: Secondary | ICD-10-CM | POA: Diagnosis not present

## 2015-12-23 DIAGNOSIS — R7989 Other specified abnormal findings of blood chemistry: Secondary | ICD-10-CM

## 2015-12-23 LAB — PSA: PSA: 0.48 ng/mL (ref 0.10–4.00)

## 2015-12-23 LAB — CBC WITH DIFFERENTIAL/PLATELET
BASOS PCT: 0.5 % (ref 0.0–3.0)
Basophils Absolute: 0 10*3/uL (ref 0.0–0.1)
EOS PCT: 3.6 % (ref 0.0–5.0)
Eosinophils Absolute: 0.3 10*3/uL (ref 0.0–0.7)
HCT: 48.5 % (ref 39.0–52.0)
Hemoglobin: 16.6 g/dL (ref 13.0–17.0)
LYMPHS ABS: 1.8 10*3/uL (ref 0.7–4.0)
Lymphocytes Relative: 24.9 % (ref 12.0–46.0)
MCHC: 34.4 g/dL (ref 30.0–36.0)
MCV: 88.1 fl (ref 78.0–100.0)
MONO ABS: 0.5 10*3/uL (ref 0.1–1.0)
MONOS PCT: 7.3 % (ref 3.0–12.0)
NEUTROS ABS: 4.6 10*3/uL (ref 1.4–7.7)
NEUTROS PCT: 63.7 % (ref 43.0–77.0)
Platelets: 244 10*3/uL (ref 150.0–400.0)
RBC: 5.5 Mil/uL (ref 4.22–5.81)
RDW: 12.8 % (ref 11.5–15.5)
WBC: 7.3 10*3/uL (ref 4.0–10.5)

## 2015-12-23 LAB — URINALYSIS
BILIRUBIN URINE: NEGATIVE
HGB URINE DIPSTICK: NEGATIVE
Ketones, ur: NEGATIVE
LEUKOCYTES UA: NEGATIVE
NITRITE: NEGATIVE
PH: 7 (ref 5.0–8.0)
Specific Gravity, Urine: 1.015 (ref 1.000–1.030)
Total Protein, Urine: NEGATIVE
Urine Glucose: NEGATIVE
Urobilinogen, UA: 0.2 (ref 0.0–1.0)

## 2015-12-23 LAB — LIPID PANEL
CHOLESTEROL: 192 mg/dL (ref 0–200)
HDL: 34.5 mg/dL — ABNORMAL LOW (ref >39.00)
NonHDL: 157.9
TRIGLYCERIDES: 258 mg/dL — AB (ref 0.0–149.0)
Total CHOL/HDL Ratio: 6
VLDL: 51.6 mg/dL — AB (ref 0.0–40.0)

## 2015-12-23 LAB — BASIC METABOLIC PANEL
BUN: 17 mg/dL (ref 6–23)
CHLORIDE: 101 meq/L (ref 96–112)
CO2: 30 meq/L (ref 19–32)
Calcium: 10 mg/dL (ref 8.4–10.5)
Creatinine, Ser: 1.17 mg/dL (ref 0.40–1.50)
GFR: 71.01 mL/min (ref >60.00)
GLUCOSE: 96 mg/dL (ref 70–99)
POTASSIUM: 4.6 meq/L (ref 3.5–5.1)
SODIUM: 138 meq/L (ref 135–145)

## 2015-12-23 LAB — HEPATIC FUNCTION PANEL
ALBUMIN: 4.7 g/dL (ref 3.5–5.2)
ALT: 34 U/L (ref 0–53)
AST: 21 U/L (ref 0–37)
Alkaline Phosphatase: 62 U/L (ref 39–117)
Bilirubin, Direct: 0.1 mg/dL (ref 0.0–0.3)
Total Bilirubin: 0.5 mg/dL (ref 0.2–1.2)
Total Protein: 7.3 g/dL (ref 6.0–8.3)

## 2015-12-23 LAB — LDL CHOLESTEROL, DIRECT: Direct LDL: 95 mg/dL

## 2015-12-23 LAB — TSH: TSH: 1.66 u[IU]/mL (ref 0.35–4.50)

## 2016-02-09 ENCOUNTER — Telehealth: Payer: Self-pay | Admitting: Internal Medicine

## 2016-02-09 ENCOUNTER — Ambulatory Visit (INDEPENDENT_AMBULATORY_CARE_PROVIDER_SITE_OTHER): Payer: BLUE CROSS/BLUE SHIELD | Admitting: Family Medicine

## 2016-02-09 ENCOUNTER — Encounter: Payer: Self-pay | Admitting: Family Medicine

## 2016-02-09 VITALS — BP 118/80 | HR 88 | Temp 97.8°F | Ht 71.0 in | Wt 186.0 lb

## 2016-02-09 DIAGNOSIS — H8110 Benign paroxysmal vertigo, unspecified ear: Secondary | ICD-10-CM | POA: Insufficient documentation

## 2016-02-09 DIAGNOSIS — J302 Other seasonal allergic rhinitis: Secondary | ICD-10-CM | POA: Diagnosis not present

## 2016-02-09 MED ORDER — FLUTICASONE PROPIONATE 50 MCG/ACT NA SUSP: 2.0000 | Freq: Every day | NASAL | Status: DC

## 2016-02-09 NOTE — Assessment & Plan Note (Signed)
?   Allergy etiology Zyrtec and flonase rto prn

## 2016-02-09 NOTE — Telephone Encounter (Signed)
[facility] - Client TELEPHONE ADVICE RECORD [facility]  Patient [facility]ROBERT [facility]  DOB: [birth year]    Initial Comment Caller states her husband has been dizzy. He's under a lot of stress.    Nurse Assessment  Nurse: Laural BenesJohnson, RN, Dondra SpryGail Date/Time Lamount Cohen(Eastern Time): 02/09/2016 9:35:50 AM  Confirm and document reason for call. If symptomatic, describe symptoms. You must click the next button to save text entered. ---Brandon MaduroRobert is dizzy and is under a lot of stress. onset two to three days  Has the patient traveled out of the country within the last 30 days? ---No  Does the patient have any new or worsening symptoms? ---Yes  Will a triage be completed? ---Yes  Related visit to physician within the last 2 weeks? ---No  Does the PT have any chronic conditions? (i.e. diabetes, asthma, etc.) ---No  Is this a behavioral health or substance abuse call? ---No     Guidelines    Guideline Title Affirmed Question Affirmed Notes  Dizziness - Vertigo [1] MODERATE dizziness (e.g., vertigo; feels very unsteady, interferes with normal activities) AND [2] has NOT been evaluated by physician for this    Final Disposition User   See Physician within 24 Hours [place], RN, Dondra SpryGail    Comments  NOTE [facility] needs to be seen today leaving in am for [place] appt [facility]. Loreen FreudYvonne Lowne, MD at 400 pm today for vertigo x 3 days under alot of stress   Referrals  REFERRED TO PCP OFFICE   Disagree/Comply: Comply

## 2016-02-09 NOTE — Assessment & Plan Note (Signed)
Take zyrtec otc daily flonase This may be etiology of vertigo

## 2016-02-09 NOTE — Progress Notes (Signed)
Pre visit review using our clinic review tool, if applicable. No additional management support is needed unless otherwise documented below in the visit note. 

## 2016-02-09 NOTE — Patient Instructions (Signed)
Epley Maneuver Self-Care  WHAT IS THE EPLEY MANEUVER?  The Epley maneuver is an exercise you can do to relieve symptoms of benign paroxysmal positional vertigo (BPPV). This condition is often just referred to as vertigo. BPPV is caused by the movement of tiny crystals (canaliths) inside your inner ear. The accumulation and movement of canaliths in your inner ear causes a sudden spinning sensation (vertigo) when you move your head to certain positions. Vertigo usually lasts about 30 seconds. BPPV usually occurs in just one ear. If you get vertigo when you lie on your left side, you probably have BPPV in your left ear. Your health care provider can tell you which ear is involved.   BPPV may be caused by a head injury. Many people older than 50 get BPPV for unknown reasons. If you have been diagnosed with BPPV, your health care provider may teach you how to do this maneuver. BPPV is not life threatening (benign) and usually goes away in time.   WHEN SHOULD I PERFORM THE EPLEY MANEUVER?  You can do this maneuver at home whenever you have symptoms of vertigo. You may do the Epley maneuver up to 3 times a day until your symptoms of vertigo go away.  HOW SHOULD I DO THE EPLEY MANEUVER?  1. Sit on the edge of a bed or table with your back straight. Your legs should be extended or hanging over the edge of the bed or table.    2. Turn your head halfway toward the affected ear.    3. Lie backward quickly with your head turned until you are lying flat on your back. You may want to position a pillow under your shoulders.    4. Hold this position for 30 seconds. You may experience an attack of vertigo. This is normal. Hold this position until the vertigo stops.  5. Then turn your head to the opposite direction until your unaffected ear is facing the floor.    6. Hold this position for 30 seconds. You may experience an attack of vertigo. This is normal. Hold this position until the vertigo stops.  7. Now turn your whole body to  the same side as your head. Hold for another 30 seconds.    8. You can then sit back up.  ARE THERE RISKS TO THIS MANEUVER?  In some cases, you may have other symptoms (such as changes in your vision, weakness, or numbness). If you have these symptoms, stop doing the maneuver and call your health care provider. Even if doing these maneuvers relieves your vertigo, you may still have dizziness. Dizziness is the sensation of light-headedness but without the sensation of movement. Even though the Epley maneuver may relieve your vertigo, it is possible that your symptoms will return within 5 years.  WHAT SHOULD I DO AFTER THIS MANEUVER?  After doing the Epley maneuver, you can return to your normal activities. Ask your doctor if there is anything you should do at home to prevent vertigo. This may include:  · Sleeping with two or more pillows to keep your head elevated.  · Not sleeping on the side of your affected ear.  · Getting up slowly from bed.  · Avoiding sudden movements during the day.  · Avoiding extreme head movement, like looking up or bending over.  · Wearing a cervical collar to prevent sudden head movements.  WHAT SHOULD I DO IF MY SYMPTOMS GET WORSE?  Call your health care provider if your vertigo gets worse. Call your provider right way if   you have other symptoms, including:   · Nausea.  · Vomiting.  · Headache.  · Weakness.  · Numbness.  · Vision changes.     This information is not intended to replace advice given to you by your health care provider. Make sure you discuss any questions you have with your health care provider.     Document Released: 09/18/2013 Document Reviewed: 09/18/2013  Elsevier Interactive Patient Education ©2016 [employer]

## 2016-02-09 NOTE — Progress Notes (Signed)
Patient ID: Brandon Atkinson, male    DOB: March 23, 1971  Age: 45 y.o. MRN: [id number]    Subjective:  Subjective HPI Brandon Atkinson presents for dizziness that last 2 mornings.  He sat up in bed and room started to spin.  It lasted 5 sec and only occurred last to am--  Not during day.  He felt a little "woozie" a couple of times today but after lunch he felt better.     Review of Systems  Constitutional: Negative for diaphoresis, appetite change, fatigue and unexpected weight change.  Eyes: Negative for pain, redness and visual disturbance.  Respiratory: Negative for cough, chest tightness, shortness of breath and wheezing.   Cardiovascular: Negative for chest pain, palpitations and leg swelling.  Endocrine: Negative for cold intolerance, heat intolerance, polydipsia, polyphagia and polyuria.  Genitourinary: Negative for dysuria, frequency and difficulty urinating.  Neurological: Negative for dizziness, light-headedness, numbness and headaches.    History Past Medical History  Diagnosis Date  . Hyperlipidemia     He has no past surgical history on file.   His family history includes Cancer (age of onset: 35) in his father; Heart disease in his father; Hypertension in his father.He reports that he has never smoked. He has never used smokeless tobacco. He reports that he does not drink alcohol or use illicit drugs.  Current Outpatient Prescriptions on File Prior to Visit  Medication Sig Dispense Refill  . aspirin 81 MG tablet Take 81 mg by mouth daily.    . rosuvastatin (CRESTOR) 40 MG tablet Take 1 tablet (40 mg total) by mouth daily. 90 tablet 3  . omeprazole (PRILOSEC) 20 MG capsule Take 1 capsule (20 mg total) by mouth daily. (Patient not taking: Reported on 12/22/2015) 90 capsule 3  . VITAMIN D, CHOLECALCIFEROL, PO Take by mouth daily. Reported on 02/09/2016     No current facility-administered medications on file prior to visit.     Objective:  Objective Physical Exam    Constitutional: He is oriented to person, place, and time. Vital signs are normal. He appears well-developed and well-nourished. He is sleeping.  HENT:  Head: Normocephalic and atraumatic.  Nose: Rhinorrhea present. No mucosal edema. Right sinus exhibits no maxillary sinus tenderness and no frontal sinus tenderness. Left sinus exhibits no maxillary sinus tenderness and no frontal sinus tenderness.  Mouth/Throat: Posterior oropharyngeal erythema present. No oropharyngeal exudate or posterior oropharyngeal edema.  Eyes: EOM are normal. Pupils are equal, round, and reactive to light.  Neck: Normal range of motion. Neck supple. No thyromegaly present.  Cardiovascular: Normal rate and regular rhythm.   No murmur heard. Pulmonary/Chest: Effort normal and breath sounds normal. No respiratory distress. He has no wheezes. He has no rales. He exhibits no tenderness.  Musculoskeletal: He exhibits no edema or tenderness.  Neurological: He is alert and oriented to person, place, and time.  Skin: Skin is warm and dry.  Psychiatric: He has a normal mood and affect. His behavior is normal. Judgment and thought content normal.  Nursing note and vitals reviewed.  BP 118/80 mmHg  Pulse 88  Temp(Src) 97.8 F (36.6 C) (Oral)  Ht  (1.803 m)  Wt 186 lb (84.369 kg)  BMI 25.95 kg/m2  SpO2 98% Wt Readings from Last 3 Encounters:  02/09/16 186 lb (84.369 kg)  12/22/15 189 lb (85.73 kg)  12/10/13 189 lb (85.73 kg)     Lab Results  Component Value Date   WBC 7.3 12/23/2015   HGB 16.6 12/23/2015  HCT 48.5 12/23/2015   PLT 244.0 12/23/2015   GLUCOSE 96 12/23/2015   CHOL 192 12/23/2015   TRIG 258.0* 12/23/2015   HDL 34.50* 12/23/2015   LDLDIRECT 95.0 12/23/2015   LDLCALC 94 12/10/2013   ALT 34 12/23/2015   AST 21 12/23/2015   NA 138 12/23/2015   K 4.6 12/23/2015   CL 101 12/23/2015   CREATININE 1.17 12/23/2015   BUN 17 12/23/2015   CO2 30 12/23/2015   TSH 1.66 12/23/2015   PSA 0.48  12/23/2015    Dg Chest 2 View  08/21/2012  *RADIOLOGY REPORT* Clinical Data: Recurrent anterior chest pain. CHEST - 2 VIEW Comparison: 04/29/2008 Findings: Lungs are clear.  Cardiomediastinal silhouette and remainder of the exam is unchanged. IMPRESSION: No acute cardiopulmonary disease. Original Report Authenticated By: Elberta Fortisaniel Boyle, M.D.     Assessment & Plan:  Plan I am having Mr. Joung start on fluticasone. I am also having him maintain his aspirin, (VITAMIN D, CHOLECALCIFEROL, PO), omeprazole, and rosuvastatin.  Meds ordered this encounter  Medications  . fluticasone (FLONASE) 50 MCG/ACT nasal spray    Sig: Place 2 sprays into both nostrils daily.    Dispense:  16 g    Refill:  6    Problem List Items Addressed This Visit    None    Visit Diagnoses    Seasonal allergies    -  Primary    Relevant Medications    fluticasone (FLONASE) 50 MCG/ACT nasal spray       Follow-up: Return if symptoms worsen or fail to improve.  Donato SchultzYvonne R Lowne Chase, DO

## 2016-12-24 ENCOUNTER — Other Ambulatory Visit: Payer: Self-pay | Admitting: Internal Medicine

## 2017-08-29 ENCOUNTER — Other Ambulatory Visit: Payer: Self-pay | Admitting: Internal Medicine

## 2017-08-29 MED ORDER — ROSUVASTATIN CALCIUM 40 MG PO TABS: 40.0000 mg | ORAL_TABLET | Freq: Every day | ORAL | 0 refills | Status: DC

## 2017-08-29 NOTE — Telephone Encounter (Signed)
Pt has made appt for 10/05/17. Per office policy sent 30 day to local pharmacy until appt...Brandon Atkinson/lmb

## 2017-10-09 ENCOUNTER — Encounter: Payer: Self-pay | Admitting: Internal Medicine

## 2017-10-11 ENCOUNTER — Other Ambulatory Visit: Payer: Self-pay | Admitting: Internal Medicine

## 2017-10-11 DIAGNOSIS — R010 Benign and innocent cardiac murmurs: Secondary | ICD-10-CM

## 2017-10-11 DIAGNOSIS — Z0000 Encounter for general adult medical examination without abnormal findings: Secondary | ICD-10-CM

## 2017-10-12 ENCOUNTER — Other Ambulatory Visit (INDEPENDENT_AMBULATORY_CARE_PROVIDER_SITE_OTHER): Payer: Self-pay

## 2017-10-12 DIAGNOSIS — R010 Benign and innocent cardiac murmurs: Secondary | ICD-10-CM

## 2017-10-12 DIAGNOSIS — Z0000 Encounter for general adult medical examination without abnormal findings: Secondary | ICD-10-CM

## 2017-10-12 LAB — CBC WITH DIFFERENTIAL/PLATELET
Basophils Absolute: 0 10*3/uL (ref 0.0–0.1)
Basophils Relative: 0.7 % (ref 0.0–3.0)
EOS PCT: 4.8 % (ref 0.0–5.0)
Eosinophils Absolute: 0.3 10*3/uL (ref 0.0–0.7)
HCT: 47.2 % (ref 39.0–52.0)
Hemoglobin: 15.8 g/dL (ref 13.0–17.0)
LYMPHS ABS: 2 10*3/uL (ref 0.7–4.0)
Lymphocytes Relative: 29.5 % (ref 12.0–46.0)
MCHC: 33.5 g/dL (ref 30.0–36.0)
MCV: 89.7 fl (ref 78.0–100.0)
MONOS PCT: 9.2 % (ref 3.0–12.0)
Monocytes Absolute: 0.6 10*3/uL (ref 0.1–1.0)
NEUTROS ABS: 3.7 10*3/uL (ref 1.4–7.7)
NEUTROS PCT: 55.8 % (ref 43.0–77.0)
PLATELETS: 227 10*3/uL (ref 150.0–400.0)
RBC: 5.26 Mil/uL (ref 4.22–5.81)
RDW: 12.9 % (ref 11.5–15.5)
WBC: 6.7 10*3/uL (ref 4.0–10.5)

## 2017-10-12 LAB — BASIC METABOLIC PANEL
BUN: 22 mg/dL (ref 6–23)
CALCIUM: 9.2 mg/dL (ref 8.4–10.5)
CO2: 29 meq/L (ref 19–32)
Chloride: 102 mEq/L (ref 96–112)
Creatinine, Ser: 1.18 mg/dL (ref 0.40–1.50)
GFR: 69.78 mL/min (ref >60.00)
Glucose, Bld: 95 mg/dL (ref 70–99)
Potassium: 4.1 mEq/L (ref 3.5–5.1)
SODIUM: 139 meq/L (ref 135–145)

## 2017-10-12 LAB — LIPID PANEL
Cholesterol: 144 mg/dL (ref 0–200)
HDL: 32.9 mg/dL — AB (ref >39.00)
LDL Cholesterol: 75 mg/dL (ref 0–99)
NonHDL: 111.49
TRIGLYCERIDES: 183 mg/dL — AB (ref 0.0–149.0)
Total CHOL/HDL Ratio: 4
VLDL: 36.6 mg/dL (ref 0.0–40.0)

## 2017-10-12 LAB — HEPATIC FUNCTION PANEL
ALT: 33 U/L (ref 0–53)
AST: 33 U/L (ref 0–37)
Albumin: 4.5 g/dL (ref 3.5–5.2)
Alkaline Phosphatase: 64 U/L (ref 39–117)
Bilirubin, Direct: 0.1 mg/dL (ref 0.0–0.3)
Total Bilirubin: 0.7 mg/dL (ref 0.2–1.2)
Total Protein: 7 g/dL (ref 6.0–8.3)

## 2017-10-12 LAB — URINALYSIS
BILIRUBIN URINE: NEGATIVE
KETONES UR: NEGATIVE
Leukocytes, UA: NEGATIVE
NITRITE: NEGATIVE
PH: 6 (ref 5.0–8.0)
SPECIFIC GRAVITY, URINE: 1.02 (ref 1.000–1.030)
TOTAL PROTEIN, URINE-UPE24: NEGATIVE
Urine Glucose: NEGATIVE
Urobilinogen, UA: 0.2 (ref 0.0–1.0)

## 2017-10-12 LAB — TSH: TSH: 1.88 u[IU]/mL (ref 0.35–4.50)

## 2017-10-13 ENCOUNTER — Telehealth: Payer: Self-pay | Admitting: Internal Medicine

## 2017-10-13 ENCOUNTER — Other Ambulatory Visit (INDEPENDENT_AMBULATORY_CARE_PROVIDER_SITE_OTHER): Payer: No Typology Code available for payment source

## 2017-10-13 ENCOUNTER — Encounter: Payer: Self-pay | Admitting: Internal Medicine

## 2017-10-13 ENCOUNTER — Ambulatory Visit: Payer: No Typology Code available for payment source | Admitting: Internal Medicine

## 2017-10-13 VITALS — BP 122/74 | HR 86 | Temp 98.3°F | Ht 71.0 in | Wt 203.0 lb

## 2017-10-13 DIAGNOSIS — R010 Benign and innocent cardiac murmurs: Secondary | ICD-10-CM

## 2017-10-13 DIAGNOSIS — E785 Hyperlipidemia, unspecified: Secondary | ICD-10-CM

## 2017-10-13 DIAGNOSIS — Z8249 Family history of ischemic heart disease and other diseases of the circulatory system: Secondary | ICD-10-CM

## 2017-10-13 DIAGNOSIS — Z0000 Encounter for general adult medical examination without abnormal findings: Secondary | ICD-10-CM | POA: Diagnosis not present

## 2017-10-13 LAB — PSA: PSA: 0.48 ng/mL (ref 0.10–4.00)

## 2017-10-13 MED ORDER — VITAMIN D3 50 MCG (2000 UT) PO CAPS: 2000.0000 [IU] | ORAL_CAPSULE | Freq: Every day | ORAL | 3 refills | Status: AC

## 2017-10-13 MED ORDER — B COMPLEX PO TABS: 1.0000 | ORAL_TABLET | Freq: Every day | ORAL | 3 refills | Status: AC

## 2017-10-13 MED ORDER — ROSUVASTATIN CALCIUM 40 MG PO TABS: 40.0000 mg | ORAL_TABLET | Freq: Every day | ORAL | 3 refills | Status: DC

## 2017-10-13 NOTE — Assessment & Plan Note (Addendum)
On Crestor Cardiol ref

## 2017-10-13 NOTE — Telephone Encounter (Signed)
Copied from CRM [phone]. Topic: Quick Communication - See Telephone Encounter >> Oct 13, 2017 10:53 AM Floria RavelingStovall, Shana A wrote: CRM for notification. See Telephone encounter for:  pt called in and said that the  rosuvastatin (CRESTOR) 40 MG tablet [[id number]][[id number]] was sent to [facility] and it is $140.00 there .  [facility] [place] [facility] [place]  [place]65.00 there   10/13/17.

## 2017-10-13 NOTE — Patient Instructions (Signed)
B complex

## 2017-10-13 NOTE — Assessment & Plan Note (Signed)
We discussed age appropriate health related issues, including available/recomended screening tests and vaccinations. We discussed a need for adhering to healthy diet and exercise. Labs were ordered to be later reviewed . All questions were answered.   

## 2017-10-13 NOTE — Progress Notes (Signed)
Subjective:  Patient ID: Brandon Atkinson, male    DOB: [birth year]  Age: 47 y.o. MRN: [id number]  CC: No chief complaint on file.   HPI VALERIA KRISKO presents for well exam   Outpatient Medications Prior to Visit  Medication Sig Dispense Refill  . aspirin 81 MG tablet Take 81 mg by mouth daily.    . rosuvastatin (CRESTOR) 40 MG tablet Take 1 tablet (40 mg total) by mouth daily. Must keep jan appt w/MD for future refills 30 tablet 0  . fluticasone (FLONASE) 50 MCG/ACT nasal spray Place 2 sprays into both nostrils daily. (Patient not taking: Reported on 10/13/2017) 16 g 6  . omeprazole (PRILOSEC) 20 MG capsule Take 1 capsule (20 mg total) by mouth daily. (Patient not taking: Reported on 12/22/2015) 90 capsule 3  . VITAMIN D, CHOLECALCIFEROL, PO Take by mouth daily. Reported on 02/09/2016     No facility-administered medications prior to visit.     ROS Review of Systems  Constitutional: Negative for appetite change, fatigue and unexpected weight change.  HENT: Negative for congestion, nosebleeds, sneezing, sore throat and trouble swallowing.   Eyes: Negative for itching and visual disturbance.  Respiratory: Negative for cough.   Cardiovascular: Negative for chest pain, palpitations and leg swelling.  Gastrointestinal: Negative for abdominal distention, blood in stool, diarrhea and nausea.  Genitourinary: Negative for frequency and hematuria.  Musculoskeletal: Negative for back pain, gait problem, joint swelling and neck pain.  Skin: Negative for rash.  Neurological: Negative for dizziness, tremors, speech difficulty and weakness.  Psychiatric/Behavioral: Negative for agitation, dysphoric mood and sleep disturbance. The patient is not nervous/anxious.     Objective:  BP 122/74 (BP Location: Left Arm, Patient Position: Sitting, Cuff Size: Large)   Pulse 86   Temp 98.3 F (36.8 C) (Oral)   Ht 5\' 11"  (1.803 m)   Wt 203 lb (92.1 kg)   SpO2 98%   BMI 28.31 kg/m   BP  Readings from Last 3 Encounters:  10/13/17 122/74  02/09/16 118/80  12/22/15 124/80    Wt Readings from Last 3 Encounters:  10/13/17 203 lb (92.1 kg)  02/09/16 186 lb (84.4 kg)  12/22/15 189 lb (85.7 kg)    Physical Exam  Constitutional: He is oriented to person, place, and time. He appears well-developed. No distress.  NAD  HENT:  Mouth/Throat: Oropharynx is clear and moist.  Eyes: Conjunctivae are normal. Pupils are equal, round, and reactive to light.  Neck: Normal range of motion. No JVD present. No thyromegaly present.  Cardiovascular: Normal rate, regular rhythm, normal heart sounds and intact distal pulses. Exam reveals no gallop and no friction rub.  No murmur heard. Pulmonary/Chest: Effort normal and breath sounds normal. No respiratory distress. He has no wheezes. He has no rales. He exhibits no tenderness.  Abdominal: Soft. Bowel sounds are normal. He exhibits no distension and no mass. There is no tenderness. There is no rebound and no guarding.  Musculoskeletal: Normal range of motion. He exhibits no edema or tenderness.  Lymphadenopathy:    He has no cervical adenopathy.  Neurological: He is alert and oriented to person, place, and time. He has normal reflexes. No cranial nerve deficit. He exhibits normal muscle tone. He displays a negative Romberg sign. Coordination and gait normal.  Skin: Skin is warm and dry. No rash noted.  Psychiatric: He has a normal mood and affect. His behavior is normal. Judgment and thought content normal.    Lab Results  Component Value Date  WBC 6.7 10/12/2017   HGB 15.8 10/12/2017   HCT 47.2 10/12/2017   PLT 227.0 10/12/2017   GLUCOSE 95 10/12/2017   CHOL 144 10/12/2017   TRIG 183.0 (H) 10/12/2017   HDL 32.90 (L) 10/12/2017   LDLDIRECT 95.0 12/23/2015   LDLCALC 75 10/12/2017   ALT 33 10/12/2017   AST 33 10/12/2017   NA 139 10/12/2017   K 4.1 10/12/2017   CL 102 10/12/2017   CREATININE 1.18 10/12/2017   BUN 22 10/12/2017    CO2 29 10/12/2017   TSH 1.88 10/12/2017   PSA 0.48 12/23/2015    Dg Chest 2 View  Result Date: 08/21/2012 *RADIOLOGY REPORT* Clinical Data: Recurrent anterior chest pain. CHEST - 2 VIEW Comparison: 04/29/2008 Findings: Lungs are clear.  Cardiomediastinal silhouette and remainder of the exam is unchanged. IMPRESSION: No acute cardiopulmonary disease. Original Report Authenticated By: Elberta Fortisaniel Boyle, M.D.    Assessment & Plan:   There are no diagnoses linked to this encounter. I have discontinued Edis A. Endres's (VITAMIN D, CHOLECALCIFEROL, PO), omeprazole, and fluticasone. I am also having him maintain his aspirin and rosuvastatin.  No orders of the defined types were placed in this encounter.    Follow-up: No Follow-up on file.  Sonda PrimesAlex Plotnikov, MD

## 2017-10-13 NOTE — Telephone Encounter (Signed)
RX sent

## 2017-12-19 ENCOUNTER — Other Ambulatory Visit: Payer: Self-pay

## 2017-12-19 ENCOUNTER — Encounter (HOSPITAL_BASED_OUTPATIENT_CLINIC_OR_DEPARTMENT_OTHER): Payer: Self-pay | Admitting: Emergency Medicine

## 2017-12-19 ENCOUNTER — Emergency Department (HOSPITAL_BASED_OUTPATIENT_CLINIC_OR_DEPARTMENT_OTHER)
Admission: EM | Admit: 2017-12-19 | Discharge: 2017-12-19 | Disposition: A | Discharge status: Home / Self Care | Payer: No Typology Code available for payment source | Attending: Emergency Medicine | Admitting: Emergency Medicine

## 2017-12-19 ENCOUNTER — Emergency Department (HOSPITAL_BASED_OUTPATIENT_CLINIC_OR_DEPARTMENT_OTHER): Payer: No Typology Code available for payment source

## 2017-12-19 DIAGNOSIS — R109 Unspecified abdominal pain: Secondary | ICD-10-CM | POA: Diagnosis present

## 2017-12-19 DIAGNOSIS — N200 Calculus of kidney: Secondary | ICD-10-CM | POA: Diagnosis not present

## 2017-12-19 DIAGNOSIS — Z7982 Long term (current) use of aspirin: Secondary | ICD-10-CM | POA: Diagnosis not present

## 2017-12-19 DIAGNOSIS — R1030 Lower abdominal pain, unspecified: Secondary | ICD-10-CM | POA: Insufficient documentation

## 2017-12-19 DIAGNOSIS — Z79899 Other long term (current) drug therapy: Secondary | ICD-10-CM | POA: Diagnosis not present

## 2017-12-19 LAB — URINALYSIS, MICROSCOPIC (REFLEX)

## 2017-12-19 LAB — URINALYSIS, ROUTINE W REFLEX MICROSCOPIC
BILIRUBIN URINE: NEGATIVE
Glucose, UA: NEGATIVE mg/dL
Ketones, ur: NEGATIVE mg/dL
Leukocytes, UA: NEGATIVE
NITRITE: NEGATIVE
Protein, ur: 30 mg/dL — AB
pH: 6 (ref 5.0–8.0)

## 2017-12-19 LAB — BASIC METABOLIC PANEL
ANION GAP: 14 (ref 5–15)
BUN: 23 mg/dL — AB (ref 6–20)
CHLORIDE: 101 mmol/L (ref 101–111)
CO2: 23 mmol/L (ref 22–32)
Calcium: 9.6 mg/dL (ref 8.9–10.3)
Creatinine, Ser: 1.42 mg/dL — ABNORMAL HIGH (ref 0.61–1.24)
GFR calc Af Amer: >60 mL/min (ref >60)
GFR calc non Af Amer: 57 mL/min — ABNORMAL LOW (ref >60)
Glucose, Bld: 155 mg/dL — ABNORMAL HIGH (ref 65–99)
POTASSIUM: 3.5 mmol/L (ref 3.5–5.1)
SODIUM: 138 mmol/L (ref 135–145)

## 2017-12-19 LAB — CBC WITH DIFFERENTIAL/PLATELET
BASOS ABS: 0 10*3/uL (ref 0.0–0.1)
Basophils Relative: 0 %
EOS ABS: 0.2 10*3/uL (ref 0.0–0.7)
Eosinophils Relative: 2 %
HCT: 45.1 % (ref 39.0–52.0)
HEMOGLOBIN: 15.6 g/dL (ref 13.0–17.0)
LYMPHS PCT: 25 %
Lymphs Abs: 2.5 10*3/uL (ref 0.7–4.0)
MCH: 30.1 pg (ref 26.0–34.0)
MCHC: 34.6 g/dL (ref 30.0–36.0)
MCV: 87.1 fL (ref 78.0–100.0)
Monocytes Absolute: 0.7 10*3/uL (ref 0.1–1.0)
Monocytes Relative: 7 %
NEUTROS PCT: 66 %
Neutro Abs: 6.7 10*3/uL (ref 1.7–7.7)
Platelets: 233 10*3/uL (ref 150–400)
RBC: 5.18 MIL/uL (ref 4.22–5.81)
RDW: 12.6 % (ref 11.5–15.5)
WBC: 10.1 10*3/uL (ref 4.0–10.5)

## 2017-12-19 MED ORDER — DICLOFENAC SODIUM ER 100 MG PO TB24: 100.0000 mg | ORAL_TABLET | Freq: Every day | ORAL | 0 refills | Status: DC

## 2017-12-19 MED ORDER — TAMSULOSIN HCL 0.4 MG PO CAPS: 0.4000 mg | ORAL_CAPSULE | Freq: Every day | ORAL | 0 refills | Status: DC

## 2017-12-19 MED ORDER — OXYCODONE-ACETAMINOPHEN 5-325 MG PO TABS: 1.0000 | ORAL_TABLET | Freq: Four times a day (QID) | ORAL | 0 refills | Status: DC | PRN

## 2017-12-19 MED ORDER — MORPHINE SULFATE (PF) 4 MG/ML IV SOLN
4.0000 mg | Freq: Once | INTRAVENOUS | Status: AC
  Administered 2017-12-19: 4 mg via INTRAVENOUS
  Filled 2017-12-19: qty 1

## 2017-12-19 MED ORDER — ONDANSETRON HCL 4 MG/2ML IJ SOLN
4.0000 mg | Freq: Once | INTRAMUSCULAR | Status: AC
  Administered 2017-12-19: 4 mg via INTRAVENOUS
  Filled 2017-12-19: qty 2

## 2017-12-19 MED ORDER — TAMSULOSIN HCL 0.4 MG PO CAPS
0.4000 mg | ORAL_CAPSULE | ORAL | Status: AC
  Administered 2017-12-19: 0.4 mg via ORAL
  Filled 2017-12-19: qty 1

## 2017-12-19 MED ORDER — ONDANSETRON 8 MG PO TBDP: ORAL_TABLET | ORAL | 0 refills | Status: DC

## 2017-12-19 MED ORDER — KETOROLAC TROMETHAMINE 30 MG/ML IJ SOLN
30.0000 mg | Freq: Once | INTRAMUSCULAR | Status: AC
  Administered 2017-12-19: 30 mg via INTRAVENOUS
  Filled 2017-12-19: qty 1

## 2017-12-19 MED FILL — OXYCODONE-ACETAMINOPHEN 5-3: 5-325 | 2 days supply | Qty: 10 | Fill #0

## 2017-12-19 MED FILL — DICLOFENAC SOD ER 100 MG TA: 100 | 10 days supply | Qty: 10 | Fill #0

## 2017-12-19 MED FILL — ONDANSETRON ODT 8 MG TABLET: 8 | 3 days supply | Qty: 6 | Fill #0

## 2017-12-19 MED FILL — TAMSULOSIN HCL 0.4 MG CAP: 0.4 | 30 days supply | Qty: 30 | Fill #0

## 2017-12-19 NOTE — ED Notes (Signed)
Pt transported to CT ?

## 2017-12-19 NOTE — ED Notes (Signed)
Pt given gingerale. Drinking it without issue.

## 2017-12-19 NOTE — ED Provider Notes (Signed)
[facility] EMERGENCY DEPARTMENT Provider Note   CSN: [id number] Arrival date & time: 12/19/17  7829     History   Chief Complaint Chief Complaint  Patient presents with  . Flank Pain    HPI Brandon Atkinson is a 47 y.o. male.  HPI  This is a 47 year old male with a history of hyperlipidemia who presents with right flank pain.  Patient reports onset of symptoms last night at 8 PM.  He states that the pain is sharp and was initially in his groin.  He also has right flank and right lower abdominal pain.  It comes and goes.  He took a [place] powder last night with some improvement of his symptoms.  However, symptoms recorded this morning.  He has not noted any urinary symptoms, dysuria, hematuria.  No history of kidney stones.  Denies any fevers.  Reports nausea without vomiting.  Currently rates his pain at 9 out of 10.  Past Medical History:  Diagnosis Date  . Hyperlipidemia     Patient Active Problem List   Diagnosis Date Noted  . Benign paroxysmal positional vertigo 02/09/2016  . Functional heart murmur 12/10/2013  . Well adult exam 10/25/2012  . Seborrheic dermatitis 10/24/2012  . Chest pain 08/21/2012  . Elevated blood-pressure reading without diagnosis of hypertension 08/21/2012  . Seasonal allergies 01/23/2010  . PARESTHESIA 12/16/2009  . ALLERGY, FOOD, HX OF 05/30/2009  . HIP PAIN 11/27/2008  . ANXIETY 04/29/2008  . Chest pain, unspecified 04/29/2008  . Dyslipidemia 05/06/2007    No past surgical history on file.      Home Medications    Prior to Admission medications   Medication Sig Start Date End Date Taking? Authorizing Provider  aspirin 81 MG tablet Take 81 mg by mouth daily.    [provider]  b complex vitamins tablet Take 1 tablet by mouth daily. 10/13/17   Plotnikov, Georgina Quint, MD  Cholecalciferol (VITAMIN D3) 2000 units capsule Take 1 capsule (2,000 Units total) by mouth daily. 10/13/17   Plotnikov, Georgina Quint, MD    rosuvastatin (CRESTOR) 40 MG tablet Take 1 tablet (40 mg total) by mouth daily. 10/13/17   Plotnikov, Georgina Quint, MD    Family History Family History  Problem Relation Age of Onset  . Hypertension Father   . Heart disease Father   . Cancer Father 45       kidney ca  . Stroke Mother 107    Social History Social History   Tobacco Use  . Smoking status: Never Smoker  . Smokeless tobacco: Never Used  Substance Use Topics  . Alcohol use: No  . Drug use: No     Allergies   Patient has no known allergies.   Review of Systems Review of Systems  Constitutional: Positive for diaphoresis. Negative for fever.  Respiratory: Negative for shortness of breath.   Cardiovascular: Negative for chest pain.  Gastrointestinal: Positive for abdominal pain and nausea. Negative for vomiting.  Genitourinary: Positive for flank pain. Negative for dysuria, hematuria and scrotal swelling.  All other systems reviewed and are negative.    Physical Exam Updated Vital Signs There were no vitals taken for this visit.  Physical Exam  Constitutional: He is oriented to person, place, and time. He appears well-developed and well-nourished.  Uncomfortable appearing, pacing  HENT:  Head: Normocephalic and atraumatic.  Cardiovascular: Normal rate, regular rhythm and normal heart sounds.  No murmur heard. Pulmonary/Chest: Effort normal and breath sounds normal. No respiratory distress. He  has no wheezes.  Abdominal: Soft. Bowel sounds are normal. There is no tenderness. There is no rebound.  Genitourinary:  Genitourinary Comments: No CVA tenderness  Musculoskeletal: He exhibits no edema.  Neurological: He is alert and oriented to person, place, and time.  Skin: Skin is warm and dry.  Psychiatric: He has a normal mood and affect.  Nursing note and vitals reviewed.    ED Treatments / Results  Labs (all labs ordered are listed, but only abnormal results are displayed) Labs Reviewed - No data to  display  EKG None  Radiology No results found.  Procedures Procedures (including critical care time)  Medications Ordered in ED Medications - No data to display   Initial Impression / Assessment and Plan / ED Course  I have reviewed the triage vital signs and the nursing notes.  Pertinent labs & imaging results that were available during my care of the patient were reviewed by me and considered in my medical decision making (see chart for details).     Patient presents with right flank and groin pain.  He is uncomfortable appearing but nontoxic.  Initial blood pressure 141/94.  Otherwise vital signs are reassuring.  History and physical exam is highly suspicious for kidney stones.  Patient was given pain and nausea medication.  Urinalysis was hematuria noted.  CT scan pending.  I have reviewed myself and there appears to be a small stone in the right UVJ.  Patient signed out pending CT scan and pain control.  Final Clinical Impressions(s) / ED Diagnoses   Final diagnoses:  Nephrolithiasis    ED Discharge Orders    None       Shon BatonHorton, Courtney F, MD 12/19/17 2307

## 2017-12-19 NOTE — ED Triage Notes (Signed)
Reports R flank and groin pain since 2000 last night.

## 2017-12-19 NOTE — ED Notes (Signed)
Pt returned from CT °

## 2018-01-09 ENCOUNTER — Ambulatory Visit (INDEPENDENT_AMBULATORY_CARE_PROVIDER_SITE_OTHER): Payer: No Typology Code available for payment source | Admitting: Cardiovascular Disease

## 2018-01-09 ENCOUNTER — Encounter: Payer: Self-pay | Admitting: Cardiovascular Disease

## 2018-01-09 VITALS — BP 116/70 | HR 88 | Ht 71.0 in | Wt 204.0 lb

## 2018-01-09 DIAGNOSIS — Z8249 Family history of ischemic heart disease and other diseases of the circulatory system: Secondary | ICD-10-CM

## 2018-01-09 DIAGNOSIS — E782 Mixed hyperlipidemia: Secondary | ICD-10-CM

## 2018-01-09 NOTE — Patient Instructions (Signed)
Dr Royann Shiversroitoru recommends that you continue on your current medications as directed. Please refer to the Current Medication list given to you today.  Your physician has requested that you have an exercise tolerance test. For further information please visit [email or web address] Please also follow instruction sheet, as given.  Dr Royann Shiversroitoru recommends that you follow-up with him as needed.

## 2018-01-09 NOTE — Progress Notes (Signed)
Cardiology consultation note:    Date:  01/10/2018   ID:  Brandon Atkinson, DOB July 26, 1971, MRN [id number]  PCP:  Tresa GarterPlotnikov, Aleksei V, MD  Cardiologist:  No primary care provider on file.   Referring MD: Tresa GarterPlotnikov, Aleksei V, MD   Chief Complaint  Patient presents with  . Advice Only    Family history of premature coronary artery disease   Brandon Atkinson is a 47 y.o. male who is being seen today for the evaluation of family history of premature heart disease at the request of Plotnikov, Brandon QuintAleksei V, MD.   History of Present Illness:    Brandon Atkinson  "[name]" is a 47 y.o. male with a hx of mixed hyperlipidemia and very early onset sudden cardiac death in his family.  His father died of a myocardial infarction at age 49.  He had 3 myocardial infarctions before dying at age 49.  His mother has had a stroke.  [name] is otherwise in excellent health.  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  The patient specifically denies any chest pain at rest or exertion, dyspnea at rest or with exertion, orthopnea, paroxysmal nocturnal dyspnea, syncope, palpitations, focal neurological deficits, intermittent claudication, lower extremity edema, unexplained weight gain, cough, hemoptysis or wheezing.  Several years ago he had some atypical chest discomfort and ended up having a stress nuclear myocardial perfusion scan.  The study was normal.  His symptoms are eventually found to be attributable to gastroesophageal reflux disease.  In 2004 he had a CT calcium score of 0.  He takes rosuvastatin.  He has 40 mg pills and breaks them in small fragments takes over a 3-day.  He has recently increased his overall dose of Crestor since he was previously taking the same amount over period of 4 days.  Past Medical History:  Diagnosis Date  . Hyperlipidemia     History  reviewed. No pertinent surgical history.  Current Medications: Current Meds  Medication Sig  . aspirin 81 MG tablet Take 81 mg by mouth daily.  Marland Kitchen. b complex vitamins tablet Take 1 tablet by mouth daily.  . Cholecalciferol (VITAMIN D3) 2000 units capsule Take 1 capsule (2,000 Units total) by mouth daily.  . rosuvastatin (CRESTOR) 40 MG tablet Take 1 tablet (40 mg total) by mouth daily.     Allergies:   Patient has no known allergies.   Social History   Socioeconomic History  . Marital status: Married    Spouse name: Not on file  . Number of children: Not on file  . Years of education: Not on file  . Highest education level: Not on file  Occupational History  . Not on file  Social Needs  . Financial resource strain: Not on file  . Food insecurity:    Worry: Not on file    Inability: Not on file  . Transportation needs:    Medical: Not on file    Non-medical: Not on file  Tobacco Use  . Smoking status: Never Smoker  . Smokeless tobacco: Never Used  Substance and Sexual Activity  . Alcohol use: No  . Drug use: No  . Sexual activity: Not on file  Lifestyle  . Physical activity:    Days per week: Not on file    Minutes per session: Not on file  . Stress: Not on file  Relationships  .  Social connections:    Talks on phone: Not on file    Gets together: Not on file    Attends religious service: Not on file    Active member of club or organization: Not on file    Attends meetings of clubs or organizations: Not on file    Relationship status: Not on file  Other Topics Concern  . Not on file  Social History Narrative  . Not on file     Family History: The patient's family history includes Cancer (age of onset: 20) in his father; Heart disease in his father; Hypertension in his father; Stroke (age of onset: 55) in his mother.  ROS:   Please see the history of present illness.     All other systems reviewed and are negative.  EKGs/Labs/Other Studies Reviewed:     The following studies were reviewed today: Notes from Dr. Posey Rea; report of calcium score in 2004  EKG:  EKG is ordered today.  The ekg ordered today demonstrates normal sinus rhythm, normal tracing, QTC 425 ms  Recent Labs: 10/12/2017: ALT 33; TSH 1.88 12/19/2017: BUN 23; Creatinine, Ser 1.42; Hemoglobin 15.6; Platelets 233; Potassium 3.5; Sodium 138  Recent Lipid Panel    Component Value Date/Time   CHOL 144 10/12/2017 0935   TRIG 183.0 (H) 10/12/2017 0935   TRIG 322 (HH) 09/26/2006 0911   HDL 32.90 (L) 10/12/2017 0935   CHOLHDL 4 10/12/2017 0935   VLDL 36.6 10/12/2017 0935   LDLCALC 75 10/12/2017 0935   LDLDIRECT 95.0 12/23/2015 1039    Physical Exam:    VS:  BP 116/70 (BP Location: Left Arm, Patient Position: Sitting, Cuff Size: Normal)   Pulse 88   Ht 5\' 11"  (1.803 m)   Wt 204 lb (92.5 kg)   BMI 28.45 kg/m     Wt Readings from Last 3 Encounters:  01/09/18 204 lb (92.5 kg)  12/19/17 195 lb (88.5 kg)  10/13/17 203 lb (92.1 kg)     GEN: Mildly overweight, appears fit well nourished, well developed in no acute distress HEENT: Normal NECK: No JVD; No carotid bruits LYMPHATICS: No lymphadenopathy CARDIAC: RRR, no murmurs, rubs, gallops RESPIRATORY:  Clear to auscultation without rales, wheezing or rhonchi  ABDOMEN: Soft, non-tender, non-distended MUSCULOSKELETAL:  No edema; No deformity  SKIN: Warm and dry NEUROLOGIC:  Alert and oriented x 3 PSYCHIATRIC:  Normal affect   ASSESSMENT:    1. Mixed hyperlipidemia   2. Family history of coronary artery disease    PLAN:    In order of problems listed above:  1. HLP: [name] has the typical adverse profile of the metabolic syndrome, but has markedly improved his overall risk profile with a combination of lifestyle changes and statin therapy.  Since he does not have a personal history of vascular disease, target LDL of 100 would be recommended, but he is actually very close to the even more stringent target of 70.  I  do not think he needs to increase the statin any further.  He does still have low HDL cholesterol and borderline elevated triglycerides.  I would not recommend more pharmacological therapy for this, but instead recommend more physical activity, reduced intake of carbohydrates (especially sweets for which he has a weakness, but also easily digestible starches such as white bread, rice, potatoes, bananas, etc.) trying to slim down to a BMI of 25 and a waist circumference of less than 34 inches. 2. FHX CAD: All of Tony's other risk factors are well addressed.  I suspect that he shares his father's adverse lipid profile, but has managed to keep vascular disease at bay because he does not smoke and is physically fit.  It is quite reasonable to perform a periodic objective assessment with a treadmill stress test which we ordered today.  I do not think he needs any imaging studies.   Medication Adjustments/Labs and Tests Ordered: Current medicines are reviewed at length with the patient today.  Concerns regarding medicines are outlined above.  Orders Placed This Encounter  Procedures  . EXERCISE TOLERANCE TEST (ETT)  . EKG 12-Lead   No orders of the defined types were placed in this encounter.   Patient Instructions  Dr Royann Shivers recommends that you continue on your current medications as directed. Please refer to the Current Medication list given to you today.  Your physician has requested that you have an exercise tolerance test. For further information please visit [email or web address] Please also follow instruction sheet, as given.  Dr Royann Shivers recommends that you follow-up with him as needed.    Signed, Thurmon Fair, MD  01/10/2018 5:28 PM    [facility]

## 2018-01-10 ENCOUNTER — Encounter: Payer: Self-pay | Admitting: Cardiovascular Disease

## 2018-01-11 ENCOUNTER — Telehealth (HOSPITAL_COMMUNITY): Payer: Self-pay

## 2018-01-11 NOTE — Telephone Encounter (Signed)
Encounter complete. 

## 2018-01-12 ENCOUNTER — Ambulatory Visit (HOSPITAL_COMMUNITY)
Admission: RE | Admit: 2018-01-12 | Discharge: 2018-01-12 | Disposition: A | Discharge status: Home / Self Care | Payer: No Typology Code available for payment source | Source: Ambulatory Visit | Attending: Cardiovascular Disease | Admitting: Cardiovascular Disease

## 2018-01-12 DIAGNOSIS — Z8249 Family history of ischemic heart disease and other diseases of the circulatory system: Secondary | ICD-10-CM

## 2018-01-13 LAB — EXERCISE TOLERANCE TEST
CSEPEW: 13.4 METS
CSEPPHR: 171 {beats}/min
Exercise duration (min): 11 min
Exercise duration (sec): 15 s
MPHR: 171 {beats}/min
Percent HR: 100 %
RPE: 16
Rest HR: 92 {beats}/min

## 2018-10-17 ENCOUNTER — Other Ambulatory Visit: Payer: Self-pay | Admitting: Internal Medicine

## 2018-12-12 ENCOUNTER — Encounter: Payer: No Typology Code available for payment source | Admitting: Internal Medicine

## 2019-09-07 ENCOUNTER — Other Ambulatory Visit: Payer: Self-pay

## 2019-09-07 DIAGNOSIS — Z20822 Contact with and (suspected) exposure to covid-19: Secondary | ICD-10-CM

## 2019-09-08 LAB — NOVEL CORONAVIRUS, NAA: SARS-CoV-2, NAA: NOT DETECTED

## 2019-11-26 ENCOUNTER — Other Ambulatory Visit: Payer: Self-pay

## 2019-11-30 ENCOUNTER — Ambulatory Visit: Payer: No Typology Code available for payment source | Admitting: Cardiovascular Disease

## 2019-12-17 ENCOUNTER — Ambulatory Visit (INDEPENDENT_AMBULATORY_CARE_PROVIDER_SITE_OTHER): Payer: No Typology Code available for payment source | Admitting: Internal Medicine

## 2019-12-17 ENCOUNTER — Encounter: Payer: Self-pay | Admitting: Internal Medicine

## 2019-12-17 DIAGNOSIS — Z1211 Encounter for screening for malignant neoplasm of colon: Secondary | ICD-10-CM

## 2019-12-17 DIAGNOSIS — Z0000 Encounter for general adult medical examination without abnormal findings: Secondary | ICD-10-CM

## 2019-12-17 DIAGNOSIS — E785 Hyperlipidemia, unspecified: Secondary | ICD-10-CM

## 2019-12-17 MED ORDER — ATORVASTATIN CALCIUM 10 MG PO TABS: 10.0000 mg | ORAL_TABLET | Freq: Every day | ORAL | 3 refills | Status: DC

## 2019-12-17 NOTE — Progress Notes (Signed)
Virtual Visit via Video Note  I connected with Brandon Atkinson on 12/17/19 at  3:20 PM EDT by a video enabled telemedicine application and verified that I am speaking with the correct person using two identifiers.   I discussed the limitations of evaluation and management by telemedicine and the availability of in person appointments. The patient expressed understanding and agreed to proceed.  History of Present Illness: We need to follow-up on dyslipidemia. Atkinson labs, colonoscopy  There has been no runny nose, cough, chest pain, shortness of breath, abdominal pain, diarrhea, constipation, arthralgias, skin rashes.   Observations/Objective: The patient appears to be in no acute distress, looks well.  Assessment and Plan:  See my Assessment and Plan. Follow Up Instructions:    I discussed the assessment and treatment plan with the patient. The patient was provided an opportunity to ask questions and all were answered. The patient agreed with the plan and demonstrated an understanding of the instructions.   The patient was advised to call back or seek an in-person evaluation if the symptoms worsen or if the condition fails to improve as anticipated.  I provided face-to-face time during this encounter. We were at different locations.   Sonda Primes, MD

## 2019-12-17 NOTE — Assessment & Plan Note (Signed)
On Atorvastatin 10 mg/d

## 2020-01-01 ENCOUNTER — Other Ambulatory Visit: Payer: Self-pay | Admitting: Internal Medicine

## 2020-01-02 ENCOUNTER — Telehealth: Payer: Self-pay

## 2020-01-02 MED ORDER — ATORVASTATIN CALCIUM 40 MG PO TABS: 40.0000 mg | ORAL_TABLET | Freq: Every day | ORAL | 1 refills | Status: DC

## 2020-01-02 NOTE — Telephone Encounter (Signed)
Ok thx.

## 2020-01-02 NOTE — Telephone Encounter (Signed)
Pt is wanting to stay on the Lipitor 40 mg. OV 12/17/19 MD change to 10 mg. Pls advise.Marland KitchenRaechel Chute

## 2020-01-02 NOTE — Telephone Encounter (Signed)
New message   Patient requesting to stay at 40 mg verse 10 mg   1.Medication Requested:atorvastatin (LIPITOR) 10 MG tablet  2. Pharmacy (Name, Street, City):[id number] - [place], [place] - [address]  3. On Med List: yes   4. Last Visit with PCP: 3.22.21  5. Next visit date with PCP: no appt is made at this time   Agent: Please be advised that RX refills may take up to 3 business days. We ask that you follow-up with your pharmacy.

## 2020-01-03 NOTE — Telephone Encounter (Signed)
Called pt no answer LMOM MD sent 40 mg Lipitor to [facility].Marland KitchenRaechel Chute

## 2020-07-11 ENCOUNTER — Other Ambulatory Visit: Payer: Self-pay | Admitting: Internal Medicine

## 2020-07-11 MED ORDER — ATORVASTATIN CALCIUM 40 MG PO TABS: 40.0000 mg | ORAL_TABLET | Freq: Every day | ORAL | 2 refills | Status: DC

## 2020-07-11 NOTE — Addendum Note (Signed)
Addended by: Deatra James on: 07/11/2020 10:00 AM   Modules accepted: Orders

## 2021-06-30 ENCOUNTER — Telehealth: Payer: Self-pay | Admitting: *Deleted

## 2021-06-30 ENCOUNTER — Telehealth: Payer: No Typology Code available for payment source | Admitting: Internal Medicine

## 2021-06-30 DIAGNOSIS — Z1211 Encounter for screening for malignant neoplasm of colon: Secondary | ICD-10-CM

## 2021-06-30 DIAGNOSIS — Z125 Encounter for screening for malignant neoplasm of prostate: Secondary | ICD-10-CM

## 2021-06-30 DIAGNOSIS — Z0000 Encounter for general adult medical examination without abnormal findings: Secondary | ICD-10-CM

## 2021-06-30 DIAGNOSIS — E785 Hyperlipidemia, unspecified: Secondary | ICD-10-CM

## 2021-06-30 MED ORDER — ATORVASTATIN CALCIUM 40 MG PO TABS: 40.0000 mg | ORAL_TABLET | Freq: Every day | ORAL | 0 refills | Status: DC

## 2021-06-30 NOTE — Telephone Encounter (Signed)
Pt was sche[facility]ke for med refill mychart visit. Called pt to let him know the the provider was running behind. Pt states he actually had to leave and take his on to her appt. He only need refill on the atorvastatin. Looking into chart pt actually need cpx w/ labs. Inform pt he actually should have been scheduled for cpx. Last ov 12/2019, and he need cholesterol chek. Inform pt will send 30 day on his atorvastatin, and made cpx appt for 10/18 @ 8:50. Pt will have labs done on the 10/14.Marland KitchenRaechel Chute

## 2021-07-09 ENCOUNTER — Other Ambulatory Visit (INDEPENDENT_AMBULATORY_CARE_PROVIDER_SITE_OTHER): Payer: No Typology Code available for payment source

## 2021-07-09 DIAGNOSIS — Z0000 Encounter for general adult medical examination without abnormal findings: Secondary | ICD-10-CM

## 2021-07-09 DIAGNOSIS — Z125 Encounter for screening for malignant neoplasm of prostate: Secondary | ICD-10-CM

## 2021-07-09 DIAGNOSIS — E785 Hyperlipidemia, unspecified: Secondary | ICD-10-CM | POA: Diagnosis not present

## 2021-07-09 LAB — URINALYSIS, ROUTINE W REFLEX MICROSCOPIC
Bilirubin Urine: NEGATIVE
Hgb urine dipstick: NEGATIVE
Ketones, ur: NEGATIVE
Leukocytes,Ua: NEGATIVE
Nitrite: NEGATIVE
Specific Gravity, Urine: 1.015 (ref 1.000–1.030)
Total Protein, Urine: NEGATIVE
Urine Glucose: NEGATIVE
Urobilinogen, UA: 0.2 (ref 0.0–1.0)
pH: 7 (ref 5.0–8.0)

## 2021-07-09 LAB — CBC WITH DIFFERENTIAL/PLATELET
Basophils Absolute: 0 10*3/uL (ref 0.0–0.1)
Basophils Relative: 0.6 % (ref 0.0–3.0)
Eosinophils Absolute: 0.2 10*3/uL (ref 0.0–0.7)
Eosinophils Relative: 3.3 % (ref 0.0–5.0)
HCT: 46.3 % (ref 39.0–52.0)
Hemoglobin: 15.6 g/dL (ref 13.0–17.0)
Lymphocytes Relative: 25.9 % (ref 12.0–46.0)
Lymphs Abs: 1.7 10*3/uL (ref 0.7–4.0)
MCHC: 33.7 g/dL (ref 30.0–36.0)
MCV: 89 fl (ref 78.0–100.0)
Monocytes Absolute: 0.5 10*3/uL (ref 0.1–1.0)
Monocytes Relative: 8.3 % (ref 3.0–12.0)
Neutro Abs: 4 10*3/uL (ref 1.4–7.7)
Neutrophils Relative %: 61.9 % (ref 43.0–77.0)
Platelets: 208 10*3/uL (ref 150.0–400.0)
RBC: 5.2 Mil/uL (ref 4.22–5.81)
RDW: 13.1 % (ref 11.5–15.5)
WBC: 6.5 10*3/uL (ref 4.0–10.5)

## 2021-07-09 LAB — LIPID PANEL
Cholesterol: 162 mg/dL (ref 0–200)
HDL: 38.3 mg/dL — ABNORMAL LOW (ref >39.00)
LDL Cholesterol: 88 mg/dL (ref 0–99)
NonHDL: 124.05
Total CHOL/HDL Ratio: 4
Triglycerides: 178 mg/dL — ABNORMAL HIGH (ref 0.0–149.0)
VLDL: 35.6 mg/dL (ref 0.0–40.0)

## 2021-07-09 LAB — HEPATIC FUNCTION PANEL
ALT: 27 U/L (ref 0–53)
AST: 21 U/L (ref 0–37)
Albumin: 4.8 g/dL (ref 3.5–5.2)
Alkaline Phosphatase: 86 U/L (ref 39–117)
Bilirubin, Direct: 0.2 mg/dL (ref 0.0–0.3)
Total Bilirubin: 0.8 mg/dL (ref 0.2–1.2)
Total Protein: 7.2 g/dL (ref 6.0–8.3)

## 2021-07-09 LAB — BASIC METABOLIC PANEL
BUN: 16 mg/dL (ref 6–23)
CO2: 32 mEq/L (ref 19–32)
Calcium: 9.9 mg/dL (ref 8.4–10.5)
Chloride: 101 mEq/L (ref 96–112)
Creatinine, Ser: 1.26 mg/dL (ref 0.40–1.50)
GFR: 65.54 mL/min (ref >60.00)
Glucose, Bld: 98 mg/dL (ref 70–99)
Potassium: 4.4 mEq/L (ref 3.5–5.1)
Sodium: 141 mEq/L (ref 135–145)

## 2021-07-09 LAB — TSH: TSH: 2.21 u[IU]/mL (ref 0.35–5.50)

## 2021-07-09 LAB — PSA: PSA: 0.54 ng/mL (ref 0.10–4.00)

## 2021-07-14 ENCOUNTER — Encounter: Payer: Self-pay | Admitting: Internal Medicine

## 2021-07-14 ENCOUNTER — Other Ambulatory Visit: Payer: Self-pay

## 2021-07-14 ENCOUNTER — Ambulatory Visit: Payer: No Typology Code available for payment source | Admitting: Internal Medicine

## 2021-07-14 VITALS — BP 132/80 | HR 82 | Temp 98.0°F | Ht 71.0 in | Wt 194.8 lb

## 2021-07-14 DIAGNOSIS — Z0000 Encounter for general adult medical examination without abnormal findings: Secondary | ICD-10-CM

## 2021-07-14 DIAGNOSIS — E785 Hyperlipidemia, unspecified: Secondary | ICD-10-CM | POA: Diagnosis not present

## 2021-07-14 DIAGNOSIS — N200 Calculus of kidney: Secondary | ICD-10-CM | POA: Insufficient documentation

## 2021-07-14 MED ORDER — ATORVASTATIN CALCIUM 40 MG PO TABS: 40.0000 mg | ORAL_TABLET | Freq: Every day | ORAL | 11 refills | Status: DC

## 2021-07-14 NOTE — Assessment & Plan Note (Addendum)
Coronary caclium CT ordered Cont w/lipitor

## 2021-07-14 NOTE — Patient Instructions (Signed)

## 2021-07-14 NOTE — Assessment & Plan Note (Signed)
2019 CT IMPRESSION: Mild right hydroureteronephrosis is noted secondary to 4 mm calculus at right ureterovesical junction. Electronically Signed   By: Lupita Raider, M.D.   On: 12/19/2017 07:27  No relapse

## 2021-07-14 NOTE — Assessment & Plan Note (Addendum)
  We discussed age appropriate health related issues, including available/recomended screening tests and vaccinations. Labs were ordered to be later reviewed . All questions were answered. We discussed one or more of the following - seat belt use, use of sunscreen/sun exposure exercise, fall risk reduction, second hand smoke exposure, firearm use and storage, seat belt use, a need for adhering to healthy diet and exercise. Labs were ordered.  All questions were answered. Colon 2021 Dr [address] Coronary caclium CT offered

## 2021-07-14 NOTE — Progress Notes (Signed)
Subjective:  Patient ID: Brandon Atkinson, male    DOB: [birth year]  Age: 50 y.o. MRN: [id number]  CC: Annual Exam   HPI YEISON SIPPEL presents for a well exam Colon 2021 Dr [address]  Outpatient Medications Prior to Visit  Medication Sig Dispense Refill   aspirin 81 MG tablet Take 81 mg by mouth daily.     atorvastatin (LIPITOR) 40 MG tablet Take 1 tablet (40 mg total) by mouth daily. Keep Annual appt for future refills 30 tablet 0   b complex vitamins tablet Take 1 tablet by mouth daily. 100 tablet 3   Cholecalciferol (VITAMIN D3) 2000 units capsule Take 1 capsule (2,000 Units total) by mouth daily. 100 capsule 3   No facility-administered medications prior to visit.    ROS: Review of Systems  Constitutional:  Negative for appetite change, fatigue and unexpected weight change.  HENT:  Negative for congestion, nosebleeds, sneezing, sore throat and trouble swallowing.   Eyes:  Negative for itching and visual disturbance.  Respiratory:  Negative for cough.   Cardiovascular:  Negative for chest pain, palpitations and leg swelling.  Gastrointestinal:  Negative for abdominal distention, blood in stool, diarrhea and nausea.  Genitourinary:  Negative for frequency and hematuria.  Musculoskeletal:  Negative for back pain, gait problem, joint swelling and neck pain.  Skin:  Negative for rash.  Neurological:  Negative for dizziness, tremors, speech difficulty and weakness.  Psychiatric/Behavioral:  Negative for agitation, dysphoric mood and sleep disturbance. The patient is not nervous/anxious.    Objective:  BP 132/80 (BP Location: Left Arm)   Pulse 82   Temp 98 F (36.7 C) (Oral)   Ht 5\' 11"  (1.803 m)   Wt 194 lb 12.8 oz (88.4 kg)   SpO2 97%   BMI 27.17 kg/m   BP Readings from Last 3 Encounters:  07/14/21 132/80  01/09/18 116/70  12/19/17 (!) 141/94    Wt Readings from Last 3 Encounters:  07/14/21 194 lb 12.8 oz (88.4 kg)  01/09/18 204 lb (92.5 kg)  12/19/17 195 lb  (88.5 kg)    Physical Exam Constitutional:      General: He is not in acute distress.    Appearance: He is well-developed.     Comments: NAD  Eyes:     Conjunctiva/sclera: Conjunctivae normal.     Pupils: Pupils are equal, round, and reactive to light.  Neck:     Thyroid: No thyromegaly.     Vascular: No JVD.  Cardiovascular:     Rate and Rhythm: Normal rate and regular rhythm.     Heart sounds: Normal heart sounds. No murmur heard.   No friction rub. No gallop.  Pulmonary:     Effort: Pulmonary effort is normal. No respiratory distress.     Breath sounds: Normal breath sounds. No wheezing or rales.  Chest:     Chest wall: No tenderness.  Abdominal:     General: Bowel sounds are normal. There is no distension.     Palpations: Abdomen is soft. There is no mass.     Tenderness: no abdominal tenderness There is no guarding or rebound.  Musculoskeletal:        General: No tenderness. Normal range of motion.     Cervical back: Normal range of motion.  Lymphadenopathy:     Cervical: No cervical adenopathy.  Skin:    General: Skin is warm and dry.     Findings: No rash.  Neurological:     Mental Status: He is  alert and oriented to person, place, and time.     Cranial Nerves: No cranial nerve deficit.     Motor: No abnormal muscle tone.     Coordination: Coordination normal.     Gait: Gait normal.     Deep Tendon Reflexes: Reflexes are normal and symmetric.  Psychiatric:        Behavior: Behavior normal.        Thought Content: Thought content normal.        Judgment: Judgment normal.  Rectal - per GI  Lab Results  Component Value Date   WBC 6.5 07/09/2021   HGB 15.6 07/09/2021   HCT 46.3 07/09/2021   PLT 208.0 07/09/2021   GLUCOSE 98 07/09/2021   CHOL 162 07/09/2021   TRIG 178.0 (H) 07/09/2021   HDL 38.30 (L) 07/09/2021   LDLDIRECT 95.0 12/23/2015   LDLCALC 88 07/09/2021   ALT 27 07/09/2021   AST 21 07/09/2021   NA 141 07/09/2021   K 4.4 07/09/2021   CL 101  07/09/2021   CREATININE 1.26 07/09/2021   BUN 16 07/09/2021   CO2 32 07/09/2021   TSH 2.21 07/09/2021   PSA 0.54 07/09/2021    EXERCISE TOLERANCE TEST (ETT)  Result Date: 01/13/2018  There was no ST segment deviation noted during stress.  Blood pressure demonstrated a normal response to exercise.  Clinically and electrically negative for ischemia  Excellent exercise capacity     Assessment & Plan:   Problem List Items Addressed This Visit     Dyslipidemia    Coronary caclium CT ordered Cont w/lipitor      Relevant Orders   CT CARDIAC SCORING (SELF PAY ONLY)   Nephrolithiasis    2019 CT IMPRESSION: Mild right hydroureteronephrosis is noted secondary to 4 mm calculus at right ureterovesical junction. Electronically Signed   By: Lupita Raider, M.D.   On: 12/19/2017 07:27  No relapse         Well adult exam - Primary     We discussed age appropriate health related issues, including available/recomended screening tests and vaccinations. Labs were ordered to be later reviewed . All questions were answered. We discussed one or more of the following - seat belt use, use of sunscreen/sun exposure exercise, fall risk reduction, second hand smoke exposure, firearm use and storage, seat belt use, a need for adhering to healthy diet and exercise. Labs were ordered.  All questions were answered. Colon 2021 Dr [address] Coronary caclium CT offered          Follow-up: Return in about 1 year (around 07/14/2022) for Wellness Exam.  Sonda Primes, MD

## 2021-08-17 ENCOUNTER — Other Ambulatory Visit: Payer: Self-pay

## 2021-08-17 ENCOUNTER — Ambulatory Visit (INDEPENDENT_AMBULATORY_CARE_PROVIDER_SITE_OTHER)
Admission: RE | Admit: 2021-08-17 | Discharge: 2021-08-17 | Disposition: A | Discharge status: Home / Self Care | Payer: Self-pay | Source: Ambulatory Visit | Attending: Internal Medicine | Admitting: Internal Medicine

## 2021-08-17 DIAGNOSIS — E785 Hyperlipidemia, unspecified: Secondary | ICD-10-CM

## 2021-08-23 ENCOUNTER — Other Ambulatory Visit: Payer: Self-pay | Admitting: Internal Medicine

## 2021-08-23 DIAGNOSIS — I2510 Atherosclerotic heart disease of native coronary artery without angina pectoris: Secondary | ICD-10-CM

## 2021-08-23 DIAGNOSIS — E785 Hyperlipidemia, unspecified: Secondary | ICD-10-CM

## 2021-08-23 DIAGNOSIS — I2583 Coronary atherosclerosis due to lipid rich plaque: Secondary | ICD-10-CM

## 2021-08-23 HISTORY — DX: Atherosclerotic heart disease of native coronary artery without angina pectoris: I25.10

## 2021-08-23 HISTORY — DX: Coronary atherosclerosis due to lipid rich plaque: I25.83

## 2021-08-31 ENCOUNTER — Encounter: Payer: Self-pay | Admitting: Internal Medicine

## 2021-10-02 ENCOUNTER — Encounter: Payer: Self-pay | Admitting: Internal Medicine

## 2021-10-02 ENCOUNTER — Ambulatory Visit (INDEPENDENT_AMBULATORY_CARE_PROVIDER_SITE_OTHER): Payer: No Typology Code available for payment source | Admitting: Internal Medicine

## 2021-10-02 ENCOUNTER — Other Ambulatory Visit: Payer: Self-pay

## 2021-10-02 VITALS — BP 126/74 | HR 89 | Ht 71.0 in | Wt 199.2 lb

## 2021-10-02 DIAGNOSIS — I2510 Atherosclerotic heart disease of native coronary artery without angina pectoris: Secondary | ICD-10-CM

## 2021-10-02 DIAGNOSIS — Z79899 Other long term (current) drug therapy: Secondary | ICD-10-CM

## 2021-10-02 DIAGNOSIS — I2583 Coronary atherosclerosis due to lipid rich plaque: Secondary | ICD-10-CM | POA: Diagnosis not present

## 2021-10-02 NOTE — Patient Instructions (Signed)
Medication Instructions:  Your physician recommends that you continue on your current medications as directed. Please refer to the Current Medication list given to you today.  *If you need a refill on your cardiac medications before your next appointment, please call your pharmacy*   Lab Work: Lipomed, CBC, hepatic If you have labs (blood work) drawn today and your tests are completely normal, you will receive your results only by: MyChart Message (if you have MyChart) OR A paper copy in the mail If you have any lab test that is abnormal or we need to change your treatment, we will call you to review the results.   Testing/Procedures: none   Follow-Up: At [facility], you and your health needs are our priority.  As part of our continuing mission to provide you with exceptional heart care, we have created designated Provider Care Teams.  These Care Teams include your primary Cardiologist (physician) and Advanced Practice Providers (APPs -  Physician Assistants and Nurse Practitioners) who all work together to provide you with the care you need, when you need it.  We recommend signing up for the patient portal called "MyChart".  Sign up information is provided on this After Visit Summary.  MyChart is used to connect with patients for Virtual Visits (Telemedicine).  Patients are able to view lab/test results, encounter notes, upcoming appointments, etc.  Non-urgent messages can be sent to your provider as well.   To learn more about what you can do with MyChart, go to [email or web address].    Your next appointment:   10 month(s)  The format for your next appointment:   In Person  Provider:   Dr. Dietrich Pates    Other Instructions

## 2021-10-02 NOTE — Progress Notes (Signed)
Cardiology Office Note   Date:  10/02/2021   ID:  LAWTON DOLLINGER, DOB [birth year], MRN [id number]  PCP:  Tresa Garter, MD  Cardiologist:   Dietrich Pates, MD       History of Present Illness: Brandon Atkinson is a 51 y.o. male with a history of mixed hyperlipidemia and also HFx of sudden cardiac death   Father with MIs  First at 24   Moter with CVA He wa seen by M Croituru in 2019.  Had CP in past   Myoviw scan normal   Felt to be GI   Calcium score in 2004 was 0 GXT in 2019 ws normal  Walked 10 min  The pt had a calcium score done this past fall   Ca score was 198   LAD focus  The pt is very active   Works /runs at [employer]   Does a lot of work outside Denies CP with activity   No SOB with activity  Did have some discomfort in L chest that lasted all day one day   Attrib to [place] food he had had     Diet Br:  cheeroes with 2% milk Lunch  Not fried  Grilled chicken   Veggies Dinner:  Systems analyst    Drinks   Water   Sometimes a coke   Sometimes a sweet tea    Current Meds  Medication Sig   aspirin 81 MG tablet Take 81 mg by mouth daily.   atorvastatin (LIPITOR) 40 MG tablet Take 1 tablet (40 mg total) by mouth daily. Keep Annual appt for future refills   b complex vitamins tablet Take 1 tablet by mouth daily.   Cholecalciferol (VITAMIN D3) 2000 units capsule Take 1 capsule (2,000 Units total) by mouth daily.     Allergies:   Patient has no known allergies.   Past Medical History:  Diagnosis Date   Chest pain, unspecified 04/29/2008   Qualifier: Diagnosis of  By: Plotnikov MD, Georgina Quint    Coronary atherosclerosis due to lipid rich plaque 08/23/2021   07/2021 Cor calcium CT score 198 - LAD On ASA, Lipitor   Dyslipidemia 05/06/2007   Chronic  Coronary caclium CT offered On Atorvastatin 10 mg/d   Hyperlipidemia    Nephrolithiasis     No past surgical history on file.   Social History:  The patient  reports that he has never smoked. He has  never used smokeless tobacco. He reports that he does not drink alcohol and does not use drugs.   Family History:  The patient's family history includes Cancer (age of onset: 47) in his father; Heart disease in his father; Hypertension in his father; Stroke (age of onset: 47) in his mother.    ROS:  Please see the history of present illness. All other systems are reviewed and  Negative to the above problem except as noted.    PHYSICAL EXAM: VS:  BP 126/74    Pulse 89    Ht 5\' 11"  (1.803 m)    Wt 199 lb 3.2 oz (90.4 kg)    SpO2 95%    BMI 27.78 kg/m   GEN: Well nourished, well developed, in no acute distress  HEENT: normal  Neck: no JVD, carotid bruits, or masses Cardiac: RRR; no murmurs   Respiratory:  clear to auscultation bilaterally, normal work of breathing GI: soft, nontender, nondistended, + BS  No hepatomegaly  MS: no deformity Moving all extremities  Skin: warm and dry, no rash Neuro:  Strength and sensation are intact Psych: euthymic mood, full affect   EKG:  EKG is ordered today.  SR 89 bpm   Nonspecific ST changes    Lipid Panel    Component Value Date/Time   CHOL 162 07/09/2021 0908   TRIG 178.0 (H) 07/09/2021 0908   TRIG 322 (HH) 09/26/2006 0911   HDL 38.30 (L) 07/09/2021 0908   CHOLHDL 4 07/09/2021 0908   VLDL 35.6 07/09/2021 0908   LDLCALC 88 07/09/2021 0908   LDLDIRECT 95.0 12/23/2015 1039      Wt Readings from Last 3 Encounters:  10/02/21 199 lb 3.2 oz (90.4 kg)  07/14/21 194 lb 12.8 oz (88.4 kg)  01/09/18 204 lb (92.5 kg)      ASSESSMENT AND PLAN:  1  CAD   Recent CT scan showed calcifications in LAD  Asymptomatic   I do not think day of mild discomfort represents angina    Follow   Reviewed symptoms, reviewed things to watch for  Keep on ASA and statin  2 HL   Will get lipids on 40 lipitor (lipomed, ApoB Lpa  )     3  Diet   Low carb    No sugar drinks   Mediterranean    TRE  F/U in Nov 2023    Current medicines are reviewed at length  with the patient today.  The patient does not have concerns regarding medicines.  Signed, Dietrich Pates, MD  10/02/2021 9:21 AM    [facility] [address] [place], [place], [place]  [address] Phone: [phone]; Fax: [phone]

## 2021-10-12 ENCOUNTER — Other Ambulatory Visit: Payer: Self-pay

## 2021-10-12 ENCOUNTER — Other Ambulatory Visit: Payer: No Typology Code available for payment source | Admitting: *Deleted

## 2021-10-12 DIAGNOSIS — Z79899 Other long term (current) drug therapy: Secondary | ICD-10-CM

## 2021-10-12 DIAGNOSIS — I2510 Atherosclerotic heart disease of native coronary artery without angina pectoris: Secondary | ICD-10-CM

## 2021-10-13 LAB — APOLIPOPROTEIN B: Apolipoprotein B: 83 mg/dL (ref <90)

## 2021-10-13 LAB — HEPATIC FUNCTION PANEL
ALT: 43 IU/L (ref 0–44)
AST: 27 IU/L (ref 0–40)
Albumin: 4.6 g/dL (ref 3.8–4.9)
Alkaline Phosphatase: 84 IU/L (ref 44–121)
Bilirubin Total: 0.4 mg/dL (ref 0.0–1.2)
Bilirubin, Direct: 0.13 mg/dL (ref 0.00–0.40)
Total Protein: 7.1 g/dL (ref 6.0–8.5)

## 2021-10-13 LAB — NMR, LIPOPROFILE
Cholesterol, Total: 151 mg/dL (ref 100–199)
HDL Particle Number: 30.1 umol/L — ABNORMAL LOW (ref >=30.5)
HDL-C: 36 mg/dL — ABNORMAL LOW (ref >39)
LDL Particle Number: 1074 nmol/L — ABNORMAL HIGH (ref <1000)
LDL Size: 19.9 nm — ABNORMAL LOW (ref >20.5)
LDL-C (NIH Calc): 84 mg/dL (ref 0–99)
LP-IR Score: 76 — ABNORMAL HIGH (ref <=45)
Small LDL Particle Number: 743 nmol/L — ABNORMAL HIGH (ref <=527)
Triglycerides: 179 mg/dL — ABNORMAL HIGH (ref 0–149)

## 2021-10-13 LAB — CBC
Hematocrit: 45.8 % (ref 37.5–51.0)
Hemoglobin: 16.4 g/dL (ref 13.0–17.7)
MCH: 30.4 pg (ref 26.6–33.0)
MCHC: 35.8 g/dL — ABNORMAL HIGH (ref 31.5–35.7)
MCV: 85 fL (ref 79–97)
Platelets: 235 10*3/uL (ref 150–450)
RBC: 5.39 x10E6/uL (ref 4.14–5.80)
RDW: 11.9 % (ref 11.6–15.4)
WBC: 6.7 10*3/uL (ref 3.4–10.8)

## 2021-10-13 LAB — LIPOPROTEIN A (LPA): Lipoprotein (a): 81.2 nmol/L — ABNORMAL HIGH (ref <75.0)

## 2021-10-23 ENCOUNTER — Telehealth: Payer: Self-pay

## 2021-10-23 DIAGNOSIS — I2510 Atherosclerotic heart disease of native coronary artery without angina pectoris: Secondary | ICD-10-CM

## 2021-10-23 DIAGNOSIS — Z79899 Other long term (current) drug therapy: Secondary | ICD-10-CM

## 2021-10-23 DIAGNOSIS — E785 Hyperlipidemia, unspecified: Secondary | ICD-10-CM

## 2021-10-23 DIAGNOSIS — I2583 Coronary atherosclerosis due to lipid rich plaque: Secondary | ICD-10-CM

## 2021-10-23 MED ORDER — EZETIMIBE 10 MG PO TABS: 10.0000 mg | ORAL_TABLET | Freq: Every day | ORAL | 3 refills | Status: DC

## 2021-10-23 NOTE — Telephone Encounter (Signed)
Pt verbalized understanding of his lab results. Repeat fasting labs planned for 01/21/22.

## 2021-10-23 NOTE — Telephone Encounter (Signed)
-----   Message from Pricilla Riffle, MD sent at 10/13/2021  4:07 PM EST ----- CBC is OK LDL concentration is 84  with particles still smaller  On 40 Lipitor     LDL should be lower given plaquing on LAD I would recomm  1  Look at diet   Lower carbs (very small amount)   Added sugar 36 g or less per day 2  Add Zetia   Binds cholesterol in gut so that it is not absorbed Get lipomed panel in 12 wks

## 2021-11-21 ENCOUNTER — Encounter: Payer: Self-pay | Admitting: Internal Medicine

## 2021-11-26 ENCOUNTER — Encounter: Payer: Self-pay | Admitting: Internal Medicine

## 2022-01-11 ENCOUNTER — Other Ambulatory Visit: Payer: Self-pay

## 2022-01-11 DIAGNOSIS — E785 Hyperlipidemia, unspecified: Secondary | ICD-10-CM

## 2022-01-11 DIAGNOSIS — I2510 Atherosclerotic heart disease of native coronary artery without angina pectoris: Secondary | ICD-10-CM

## 2022-01-11 DIAGNOSIS — Z79899 Other long term (current) drug therapy: Secondary | ICD-10-CM

## 2022-01-12 LAB — NMR, LIPOPROFILE
Cholesterol, Total: 99 mg/dL — ABNORMAL LOW (ref 100–199)
HDL Particle Number: 29.5 umol/L — ABNORMAL LOW (ref >=30.5)
HDL-C: 32 mg/dL — ABNORMAL LOW (ref >39)
LDL Particle Number: 819 nmol/L (ref <1000)
LDL Size: 19.7 nm — ABNORMAL LOW (ref >20.5)
LDL-C (NIH Calc): 49 mg/dL (ref 0–99)
LP-IR Score: 66 — ABNORMAL HIGH (ref <=45)
Small LDL Particle Number: 664 nmol/L — ABNORMAL HIGH (ref <=527)
Triglycerides: 90 mg/dL (ref 0–149)

## 2022-01-21 ENCOUNTER — Other Ambulatory Visit: Payer: No Typology Code available for payment source

## 2022-07-27 ENCOUNTER — Other Ambulatory Visit: Payer: Self-pay | Admitting: Internal Medicine

## 2022-08-30 ENCOUNTER — Other Ambulatory Visit: Payer: Self-pay | Admitting: Internal Medicine

## 2022-10-06 IMAGING — CT CT CARDIAC CORONARY ARTERY CALCIUM SCORE
3 series · 14 of 20 positions shown, 16 images · non-contrast
Comparison: None.
COMPARISON: None.

Addendum:
EXAM:
OVER-READ INTERPRETATION  CT CHEST

The following report is an over-read performed by radiologist Dr.
Olimpia Tiger [REDACTED] on 08/17/2021. This
over-read does not include interpretation of cardiac or coronary
anatomy or pathology. The coronary calcium score interpretation by
the cardiologist is attached.
CLINICAL DATA: Cardiovascular Disease Risk stratification
Coronary Calcium Score
TECHNIQUE: A gated, non-contrast computed tomography scan of the heart was
performed using 3mm slice thickness. Axial images were analyzed on a
dedicated workstation. Calcium scoring of the coronary arteries was
performed using the Agatston method.

[Series 2: cascseq 2.0 sa36 (id) (id) · axial · 0.43mm/px · z∈[+1391,+1471]mm · 4 of 68 slices shown]
[im 14/68  vessel]
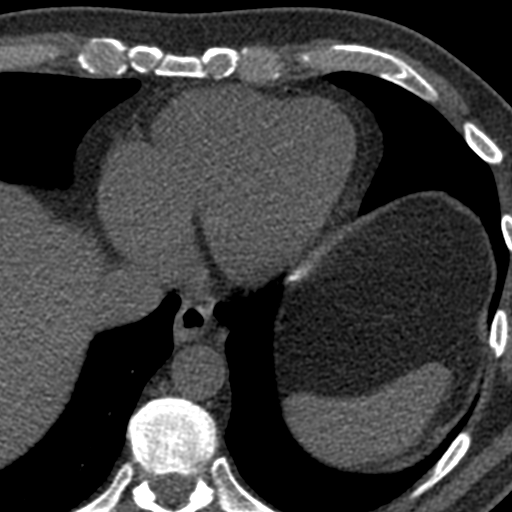
[im 27/68  vessel]
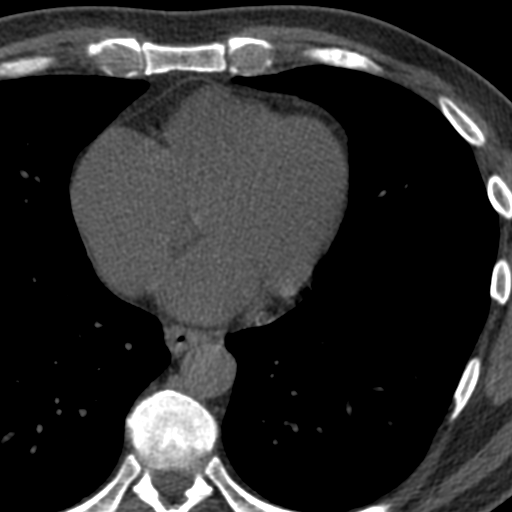
[im 41/68  vessel]
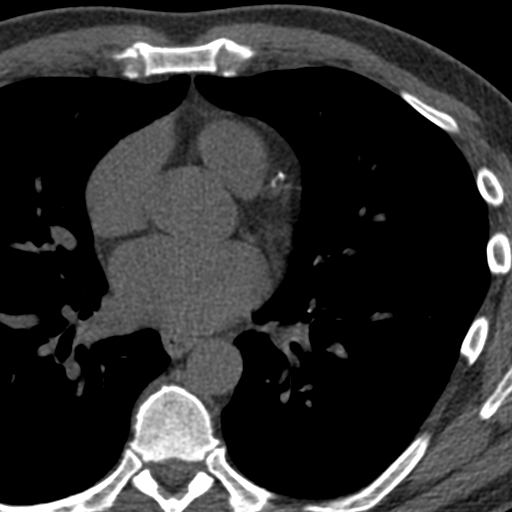
[im 54/68  vessel]
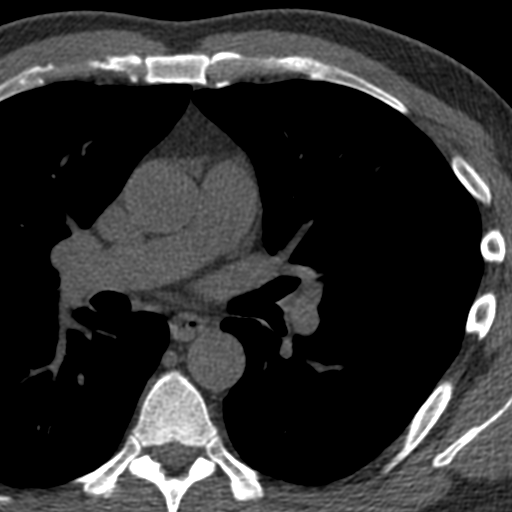

[Series 3: cascseq 2.0 bf37 st · axial · 0.66mm/px · z∈[+1387,+1475]mm · 5 of 68 slices shown, 7 images]
[im 12/68  vessel]
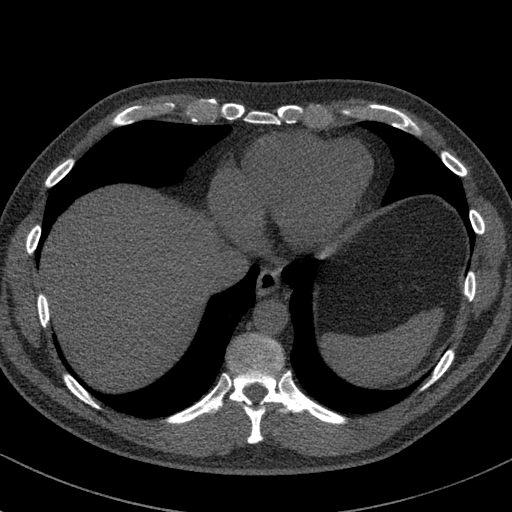
[im 12/68  lung]
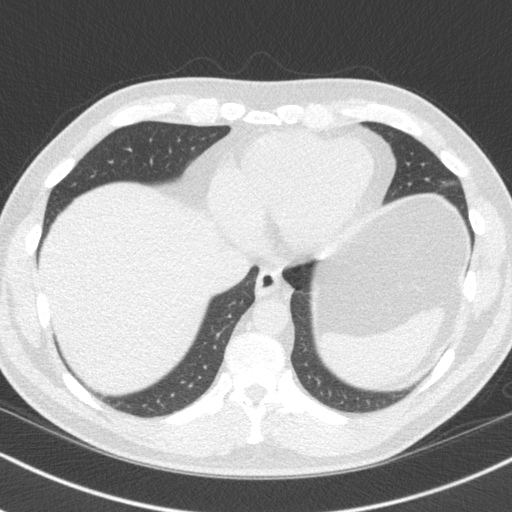
[im 23/68  vessel]
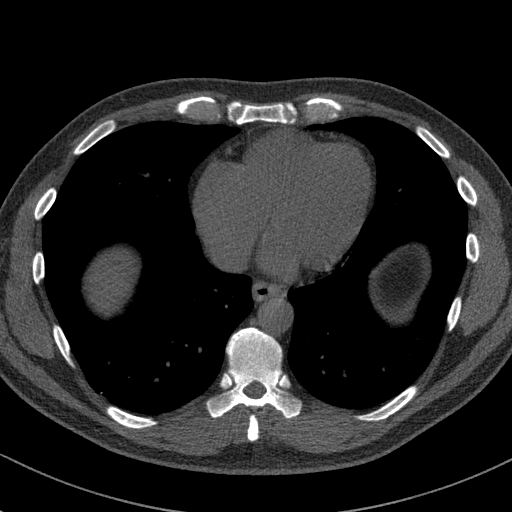
[im 34/68  vessel]
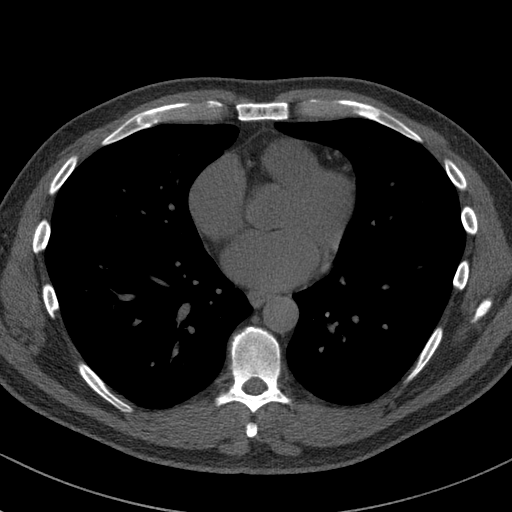
[im 45/68  vessel]
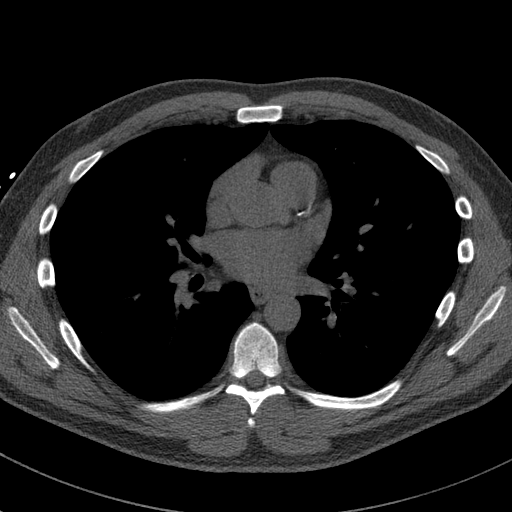
[im 56/68  vessel]
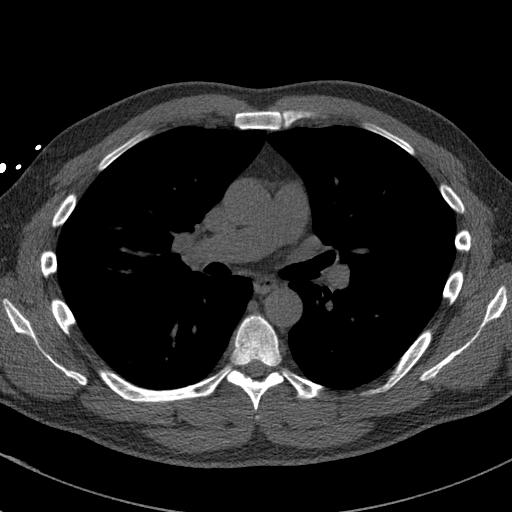
[im 56/68  lung]
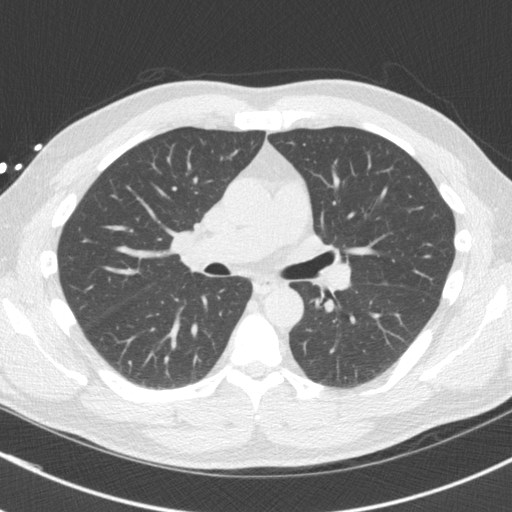

[Series 4: cascseq 2.0 br59 lung · axial · 0.66mm/px · z∈[+1387,+1475]mm · 5 of 68 slices shown]
[im 12/68  lung]
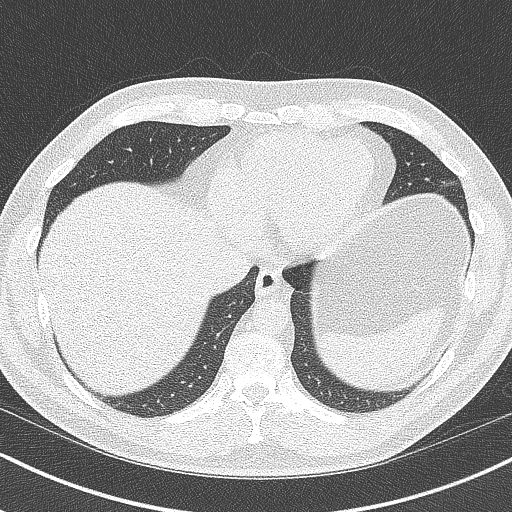
[im 23/68  lung]
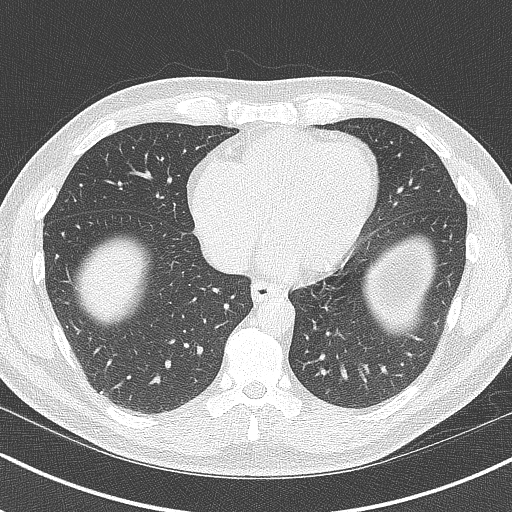
[im 34/68  lung]
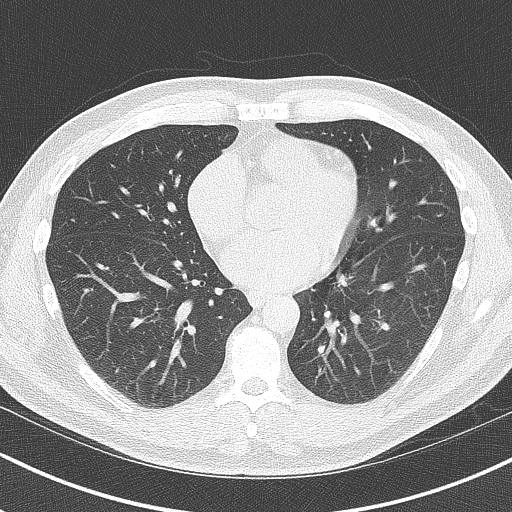
[im 45/68  lung]
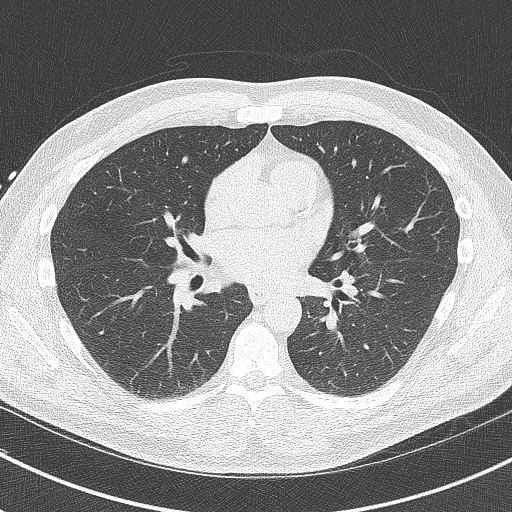
[im 56/68  lung]
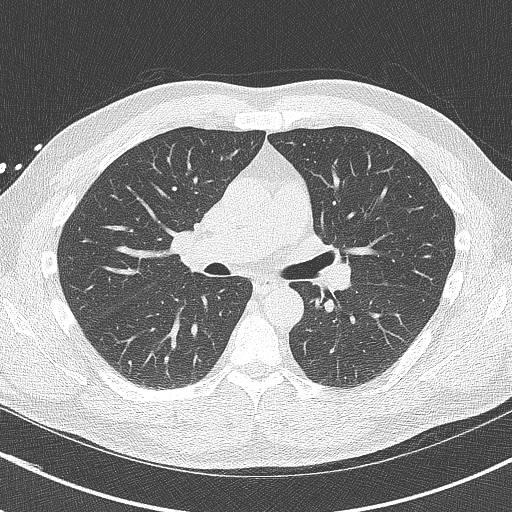

[14 of 20 positions shown; findings below may reference images not displayed]

FINDINGS: Vascular: Heart is normal size.  Aorta normal caliber.

Mediastinum/Nodes: No adenopathy

Lungs/Pleura: No confluent airspace opacities or effusions.

Upper Abdomen: Imaging into the upper abdomen demonstrates no acute
findings.

Musculoskeletal: Chest wall soft tissues are unremarkable. No acute
bony abnormality.
IMPRESSION: No acute or significant extracardiac abnormality.
FINDINGS: Coronary Calcium Score:

Left main: 0

Left anterior descending artery: 198

Left circumflex artery: 0

Right coronary artery: 0

Total: 198

Percentile: 92nd

Pericardium: Normal.

Non-cardiac: See separate report from [REDACTED].
IMPRESSION: Coronary calcium score of 198. This was 92nd percentile for age-,
race-, and sex-matched controls.



If CAC=0, it is reasonable to withhold statin therapy and reassess
in 5 to 10 years, as long as higher risk conditions are absent
(diabetes mellitus, family history of premature CHD in first degree
relatives (males <55 years; females <65 years), cigarette smoking,
or LDL >=190 mg/dL).

If CAC is 1 to 99, it is reasonable to initiate statin therapy for
patients >=55 years of age.

If CAC is >=100 or >=75th percentile, it is reasonable to initiate
statin therapy at any age.

Cardiology referral should be considered for patients with CAC
scores >=400 or >=75th percentile.

*6223 AHA/ACC/AACVPR/AAPA/ABC/ADAMA/KEL/TRACY/[place]par/TIGER/BAIGES/CHASRONE
Guideline on the Management of Blood Cholesterol: A Report of the
American College of Cardiology/American Heart Association Task Force
on Clinical Practice Guidelines. J Am Coll Cardiol.
1889;73(24):7283-7922.

*** End of Addendum ***
EXAM:
OVER-READ INTERPRETATION  CT CHEST

The following report is an over-read performed by radiologist Dr.
Olimpia Tiger [REDACTED] on 08/17/2021. This
over-read does not include interpretation of cardiac or coronary
anatomy or pathology. The coronary calcium score interpretation by
the cardiologist is attached.
FINDINGS: Vascular: Heart is normal size.  Aorta normal caliber.

Mediastinum/Nodes: No adenopathy

Lungs/Pleura: No confluent airspace opacities or effusions.

Upper Abdomen: Imaging into the upper abdomen demonstrates no acute
findings.

Musculoskeletal: Chest wall soft tissues are unremarkable. No acute
bony abnormality.
IMPRESSION: No acute or significant extracardiac abnormality.

## 2022-10-19 ENCOUNTER — Other Ambulatory Visit: Payer: Self-pay | Admitting: Internal Medicine
